# Patient Record
Sex: Female | Born: 1955 | Race: Black or African American | Hispanic: No | Smoking: Current every day smoker
Health system: Southern US, Community
[De-identification: ages and names within clinical notes are randomized; demographics above are authoritative.]

## PROBLEM LIST (undated history)

## (undated) DIAGNOSIS — J449 Chronic obstructive pulmonary disease, unspecified: Secondary | ICD-10-CM

---

## 2008-08-01 ENCOUNTER — Ambulatory Visit: Payer: Self-pay | Admitting: Nurse Practitioner

## 2008-08-01 DIAGNOSIS — F329 Major depressive disorder, single episode, unspecified: Secondary | ICD-10-CM

## 2008-08-01 DIAGNOSIS — R634 Abnormal weight loss: Secondary | ICD-10-CM

## 2008-08-01 LAB — CONVERTED CEMR LAB
Albumin: 4.2 g/dL (ref 3.5–5.2)
Alkaline Phosphatase: 91 units/L (ref 39–117)
BUN: 7 mg/dL (ref 6–23)
Basophils Absolute: 0 10*3/uL (ref 0.0–0.1)
CO2: 26 meq/L (ref 19–32)
Calcium: 9 mg/dL (ref 8.4–10.5)
Creatinine, Ser: 0.73 mg/dL (ref 0.40–1.20)
Glucose, Bld: 61 mg/dL — ABNORMAL LOW (ref 70–99)
HCT: 50.1 % — ABNORMAL HIGH (ref 36.0–46.0)
Lymphocytes Relative: 41 % (ref 12–46)
Lymphs Abs: 2.1 10*3/uL (ref 0.7–4.0)
MCHC: 32.3 g/dL (ref 30.0–36.0)
Monocytes Absolute: 0.5 10*3/uL (ref 0.1–1.0)
Neutrophils Relative %: 46 % (ref 43–77)
Platelets: 282 10*3/uL (ref 150–400)
Potassium: 6.6 meq/L (ref 3.5–5.3)

## 2008-08-02 ENCOUNTER — Encounter (INDEPENDENT_AMBULATORY_CARE_PROVIDER_SITE_OTHER): Payer: Self-pay | Admitting: Nurse Practitioner

## 2008-08-10 ENCOUNTER — Encounter (INDEPENDENT_AMBULATORY_CARE_PROVIDER_SITE_OTHER): Payer: Self-pay | Admitting: *Deleted

## 2008-08-17 ENCOUNTER — Ambulatory Visit: Payer: Self-pay | Admitting: *Deleted

## 2008-08-23 ENCOUNTER — Ambulatory Visit: Payer: Self-pay | Admitting: Nurse Practitioner

## 2008-08-23 DIAGNOSIS — E875 Hyperkalemia: Secondary | ICD-10-CM | POA: Insufficient documentation

## 2008-08-23 DIAGNOSIS — F17200 Nicotine dependence, unspecified, uncomplicated: Secondary | ICD-10-CM | POA: Insufficient documentation

## 2008-08-24 ENCOUNTER — Encounter (INDEPENDENT_AMBULATORY_CARE_PROVIDER_SITE_OTHER): Payer: Self-pay | Admitting: Nurse Practitioner

## 2008-08-24 LAB — CONVERTED CEMR LAB
BUN: 6 mg/dL (ref 6–23)
Calcium: 9.8 mg/dL (ref 8.4–10.5)
Chloride: 103 meq/L (ref 96–112)
Creatinine, Ser: 0.67 mg/dL (ref 0.40–1.20)
Glucose, Bld: 97 mg/dL (ref 70–99)
Lymphs Abs: 2 10*3/uL (ref 0.7–4.0)
MCV: 89.2 fL (ref 78.0–100.0)
Monocytes Absolute: 0.5 10*3/uL (ref 0.1–1.0)
Neutro Abs: 3.6 10*3/uL (ref 1.7–7.7)
Neutrophils Relative %: 57 % (ref 43–77)
RBC: 5.44 M/uL — ABNORMAL HIGH (ref 3.87–5.11)
Triglycerides: 219 mg/dL — ABNORMAL HIGH (ref <150)
WBC: 6.3 10*3/uL (ref 4.0–10.5)

## 2008-08-26 ENCOUNTER — Ambulatory Visit: Payer: Self-pay | Admitting: Nurse Practitioner

## 2008-10-24 ENCOUNTER — Ambulatory Visit: Payer: Self-pay | Admitting: Nurse Practitioner

## 2008-10-24 ENCOUNTER — Encounter (INDEPENDENT_AMBULATORY_CARE_PROVIDER_SITE_OTHER): Payer: Self-pay | Admitting: Nurse Practitioner

## 2008-10-24 DIAGNOSIS — D751 Secondary polycythemia: Secondary | ICD-10-CM | POA: Insufficient documentation

## 2008-10-24 DIAGNOSIS — Z87898 Personal history of other specified conditions: Secondary | ICD-10-CM | POA: Insufficient documentation

## 2008-10-24 DIAGNOSIS — N3000 Acute cystitis without hematuria: Secondary | ICD-10-CM | POA: Insufficient documentation

## 2008-10-24 DIAGNOSIS — Z780 Asymptomatic menopausal state: Secondary | ICD-10-CM | POA: Insufficient documentation

## 2008-10-24 LAB — CONVERTED CEMR LAB
Glucose, Urine, Semiquant: NEGATIVE
Protein, U semiquant: NEGATIVE
pH: 5

## 2008-10-25 ENCOUNTER — Encounter (INDEPENDENT_AMBULATORY_CARE_PROVIDER_SITE_OTHER): Payer: Self-pay | Admitting: Nurse Practitioner

## 2008-10-25 LAB — CONVERTED CEMR LAB
Albumin: 4.2 g/dL (ref 3.5–5.2)
Basophils Absolute: 0 10*3/uL (ref 0.0–0.1)
Basophils Relative: 0 % (ref 0–1)
Calcium: 9.4 mg/dL (ref 8.4–10.5)
Eosinophils Absolute: 0.2 10*3/uL (ref 0.0–0.7)
Erythropoietin: 12 milliintl units/mL (ref 2.6–34.0)
Iron: 153 ug/dL — ABNORMAL HIGH (ref 42–145)
Lymphs Abs: 2.6 10*3/uL (ref 0.7–4.0)
MCV: 85.8 fL (ref 78.0–100.0)
Monocytes Relative: 11 % (ref 3–12)
Neutrophils Relative %: 44 % (ref 43–77)
Platelets: 207 10*3/uL (ref 150–400)
RBC: 4.8 M/uL (ref 3.87–5.11)
Total Bilirubin: 0.4 mg/dL (ref 0.3–1.2)
Total Protein: 7.4 g/dL (ref 6.0–8.3)
Vitamin B-12: 344 pg/mL (ref 211–911)
WBC: 6.2 10*3/uL (ref 4.0–10.5)

## 2008-10-28 ENCOUNTER — Encounter (INDEPENDENT_AMBULATORY_CARE_PROVIDER_SITE_OTHER): Payer: Self-pay | Admitting: Nurse Practitioner

## 2008-10-29 ENCOUNTER — Encounter (INDEPENDENT_AMBULATORY_CARE_PROVIDER_SITE_OTHER): Payer: Self-pay | Admitting: Nurse Practitioner

## 2008-10-31 LAB — CONVERTED CEMR LAB
Chlamydia, DNA Probe: NEGATIVE
GC Probe Amp, Genital: NEGATIVE

## 2008-11-08 ENCOUNTER — Encounter (INDEPENDENT_AMBULATORY_CARE_PROVIDER_SITE_OTHER): Payer: Self-pay | Admitting: *Deleted

## 2008-11-10 ENCOUNTER — Telehealth (INDEPENDENT_AMBULATORY_CARE_PROVIDER_SITE_OTHER): Payer: Self-pay | Admitting: Nurse Practitioner

## 2008-11-10 ENCOUNTER — Ambulatory Visit: Payer: Self-pay | Admitting: Nurse Practitioner

## 2008-11-10 LAB — CONVERTED CEMR LAB: PTH: 45.9 pg/mL (ref 14.0–72.0)

## 2008-11-14 ENCOUNTER — Ambulatory Visit (HOSPITAL_COMMUNITY): Admission: RE | Admit: 2008-11-14 | Discharge: 2008-11-14 | Payer: Self-pay | Admitting: Family Medicine

## 2008-11-14 ENCOUNTER — Encounter (INDEPENDENT_AMBULATORY_CARE_PROVIDER_SITE_OTHER): Payer: Self-pay | Admitting: Nurse Practitioner

## 2008-11-17 DIAGNOSIS — M949 Disorder of cartilage, unspecified: Secondary | ICD-10-CM

## 2008-11-17 DIAGNOSIS — M899 Disorder of bone, unspecified: Secondary | ICD-10-CM | POA: Insufficient documentation

## 2008-11-18 ENCOUNTER — Encounter (INDEPENDENT_AMBULATORY_CARE_PROVIDER_SITE_OTHER): Payer: Self-pay | Admitting: Nurse Practitioner

## 2008-11-18 ENCOUNTER — Ambulatory Visit (HOSPITAL_COMMUNITY): Admission: RE | Admit: 2008-11-18 | Discharge: 2008-11-18 | Payer: Self-pay | Admitting: Nurse Practitioner

## 2008-11-22 ENCOUNTER — Encounter (INDEPENDENT_AMBULATORY_CARE_PROVIDER_SITE_OTHER): Payer: Self-pay | Admitting: Nurse Practitioner

## 2008-11-28 ENCOUNTER — Encounter (INDEPENDENT_AMBULATORY_CARE_PROVIDER_SITE_OTHER): Payer: Self-pay | Admitting: Nurse Practitioner

## 2008-12-05 ENCOUNTER — Ambulatory Visit (HOSPITAL_COMMUNITY): Admission: RE | Admit: 2008-12-05 | Discharge: 2008-12-05 | Payer: Self-pay | Admitting: Family Medicine

## 2008-12-06 ENCOUNTER — Telehealth (INDEPENDENT_AMBULATORY_CARE_PROVIDER_SITE_OTHER): Payer: Self-pay | Admitting: *Deleted

## 2008-12-06 DIAGNOSIS — K029 Dental caries, unspecified: Secondary | ICD-10-CM | POA: Insufficient documentation

## 2008-12-15 ENCOUNTER — Encounter (INDEPENDENT_AMBULATORY_CARE_PROVIDER_SITE_OTHER): Payer: Self-pay | Admitting: Nurse Practitioner

## 2008-12-27 ENCOUNTER — Telehealth (INDEPENDENT_AMBULATORY_CARE_PROVIDER_SITE_OTHER): Payer: Self-pay | Admitting: Nurse Practitioner

## 2009-01-10 ENCOUNTER — Encounter (INDEPENDENT_AMBULATORY_CARE_PROVIDER_SITE_OTHER): Payer: Self-pay | Admitting: *Deleted

## 2009-01-26 ENCOUNTER — Telehealth (INDEPENDENT_AMBULATORY_CARE_PROVIDER_SITE_OTHER): Payer: Self-pay | Admitting: *Deleted

## 2009-09-04 ENCOUNTER — Ambulatory Visit: Payer: Self-pay | Admitting: Nurse Practitioner

## 2009-09-04 DIAGNOSIS — R030 Elevated blood-pressure reading, without diagnosis of hypertension: Secondary | ICD-10-CM | POA: Insufficient documentation

## 2009-09-05 ENCOUNTER — Encounter (INDEPENDENT_AMBULATORY_CARE_PROVIDER_SITE_OTHER): Payer: Self-pay | Admitting: Nurse Practitioner

## 2009-10-31 ENCOUNTER — Ambulatory Visit: Payer: Self-pay | Admitting: Nurse Practitioner

## 2009-10-31 DIAGNOSIS — L659 Nonscarring hair loss, unspecified: Secondary | ICD-10-CM | POA: Insufficient documentation

## 2009-10-31 LAB — CONVERTED CEMR LAB
ALT: 12 units/L (ref 0–35)
AST: 20 units/L (ref 0–37)
Alkaline Phosphatase: 66 units/L (ref 39–117)
Basophils Absolute: 0 10*3/uL (ref 0.0–0.1)
Chloride: 103 meq/L (ref 96–112)
Creatinine, Ser: 0.82 mg/dL (ref 0.40–1.20)
Glucose, Bld: 82 mg/dL (ref 70–99)
Hemoglobin: 13.9 g/dL (ref 12.0–15.0)
Lymphocytes Relative: 47 % — ABNORMAL HIGH (ref 12–46)
Lymphs Abs: 2.9 10*3/uL (ref 0.7–4.0)
Monocytes Relative: 11 % (ref 3–12)
Neutro Abs: 2.3 10*3/uL (ref 1.7–7.7)
Neutrophils Relative %: 36 % — ABNORMAL LOW (ref 43–77)
Potassium: 4.7 meq/L (ref 3.5–5.3)
Rapid HIV Screen: NEGATIVE
Sodium: 141 meq/L (ref 135–145)
Total Bilirubin: 0.3 mg/dL (ref 0.3–1.2)
Total Protein: 7.4 g/dL (ref 6.0–8.3)

## 2009-11-09 ENCOUNTER — Encounter (INDEPENDENT_AMBULATORY_CARE_PROVIDER_SITE_OTHER): Payer: Self-pay | Admitting: Nurse Practitioner

## 2009-12-25 ENCOUNTER — Ambulatory Visit (HOSPITAL_COMMUNITY): Admission: RE | Admit: 2009-12-25 | Discharge: 2009-12-25 | Payer: Self-pay | Admitting: Internal Medicine

## 2010-01-08 ENCOUNTER — Ambulatory Visit: Payer: Self-pay | Admitting: Nurse Practitioner

## 2010-01-08 LAB — CONVERTED CEMR LAB
Cholesterol: 167 mg/dL (ref 0–200)
GC Probe Amp, Genital: NEGATIVE
LDL Cholesterol: 69 mg/dL (ref 0–99)
Protein, U semiquant: NEGATIVE
Total CHOL/HDL Ratio: 2.8
Triglycerides: 190 mg/dL — ABNORMAL HIGH (ref <150)
Urobilinogen, UA: 0.2
WBC Urine, dipstick: NEGATIVE

## 2010-01-10 ENCOUNTER — Encounter (INDEPENDENT_AMBULATORY_CARE_PROVIDER_SITE_OTHER): Payer: Self-pay | Admitting: Nurse Practitioner

## 2010-01-11 LAB — CONVERTED CEMR LAB: Pap Smear: NEGATIVE

## 2010-03-08 ENCOUNTER — Ambulatory Visit: Payer: Self-pay | Admitting: Nurse Practitioner

## 2010-03-16 ENCOUNTER — Ambulatory Visit: Payer: Self-pay | Admitting: Nurse Practitioner

## 2010-04-13 ENCOUNTER — Ambulatory Visit: Payer: Self-pay | Admitting: Nurse Practitioner

## 2010-08-22 ENCOUNTER — Ambulatory Visit: Payer: Self-pay | Admitting: Nurse Practitioner

## 2010-11-23 ENCOUNTER — Telehealth (INDEPENDENT_AMBULATORY_CARE_PROVIDER_SITE_OTHER): Payer: Self-pay | Admitting: Nurse Practitioner

## 2010-12-02 ENCOUNTER — Encounter: Payer: Self-pay | Admitting: Family Medicine

## 2010-12-02 ENCOUNTER — Encounter: Payer: Self-pay | Admitting: Internal Medicine

## 2010-12-06 ENCOUNTER — Ambulatory Visit
Admission: RE | Admit: 2010-12-06 | Discharge: 2010-12-06 | Payer: Self-pay | Source: Home / Self Care | Attending: Nurse Practitioner | Admitting: Nurse Practitioner

## 2010-12-06 ENCOUNTER — Telehealth (INDEPENDENT_AMBULATORY_CARE_PROVIDER_SITE_OTHER): Payer: Self-pay | Admitting: Nurse Practitioner

## 2010-12-06 ENCOUNTER — Encounter (INDEPENDENT_AMBULATORY_CARE_PROVIDER_SITE_OTHER): Payer: Self-pay | Admitting: Nurse Practitioner

## 2010-12-06 DIAGNOSIS — R252 Cramp and spasm: Secondary | ICD-10-CM | POA: Insufficient documentation

## 2010-12-06 LAB — CONVERTED CEMR LAB
ALT: 10 units/L (ref 0–35)
AST: 18 units/L (ref 0–37)
BUN: 7 mg/dL (ref 6–23)
Basophils Absolute: 0 10*3/uL (ref 0.0–0.1)
Basophils Relative: 1 % (ref 0–1)
Calcium: 9.6 mg/dL (ref 8.4–10.5)
Creatinine, Ser: 0.76 mg/dL (ref 0.40–1.20)
Eosinophils Relative: 3 % (ref 0–5)
Hemoglobin: 13.2 g/dL (ref 12.0–15.0)
Lymphs Abs: 2.3 10*3/uL (ref 0.7–4.0)
MCHC: 32.8 g/dL (ref 30.0–36.0)
MCV: 86.1 fL (ref 78.0–100.0)
Monocytes Relative: 8 % (ref 3–12)
Neutrophils Relative %: 51 % (ref 43–77)
Platelets: 255 10*3/uL (ref 150–400)
RBC: 4.67 M/uL (ref 3.87–5.11)
RDW: 16.1 % — ABNORMAL HIGH (ref 11.5–15.5)
Total Bilirubin: 0.3 mg/dL (ref 0.3–1.2)
Total Protein: 7.2 g/dL (ref 6.0–8.3)

## 2010-12-07 ENCOUNTER — Encounter (INDEPENDENT_AMBULATORY_CARE_PROVIDER_SITE_OTHER): Payer: Self-pay | Admitting: Nurse Practitioner

## 2010-12-11 NOTE — Letter (Signed)
Summary: REFERRAL [facility]  REFERRAL [facility]   Imported By: Arta Bruce 05/02/2010 09:50:34  _____________________________________________________________________  External Attachment:    Type:   Image     Comment:   External Document

## 2010-12-11 NOTE — Progress Notes (Signed)
Summary: Office Visit//DEPRESSION SCREENING  Office Visit//DEPRESSION SCREENING   Imported By: Arta Bruce 03/08/2010 12:47:26  _____________________________________________________________________  External Attachment:    Type:   Image     Comment:   External Document

## 2010-12-11 NOTE — Letter (Signed)
Summary: Lipid Letter  [facility]  [address] [place], [place] [address]   Phone: [phone]  Fax: [phone]    01/10/2010  Sherry Douglas [address] [place], [place]  [address]  Dear Sherry Douglas:  We have carefully reviewed your last lipid profile from 01/08/2010 and the results are noted below with a summary of recommendations for lipid management.    Cholesterol:       167     Goal: less than 200   HDL "good" Cholesterol:   60     Goal: greater than 40   LDL "bad" Cholesterol:   69     Goal: less than 130   Triglycerides:       190     Goal: less than 140    Labs done during recent office visit show that your triglycerides are slightly elevated.  This has to do with the amount of fat in your food.  No need for medication at this time, I just wanted to make you aware.  Pap Smear results _______________________________.      Current Medications: 1)    Remeron 15 Mg Tabs (Mirtazapine) .Marland Kitchen.. 1 tablet by mouth nightly for mood and appetite 2)    Calcium 600-d 600-400 Mg-unit Tabs (Calcium carbonate-vitamin d) .Marland Kitchen.. 1 tablet by mouth two times a day for bones 3)    Actonel 35 Mg Tabs (Risedronate sodium) .Marland Kitchen.. 1 tablet by mouth weekly for bones 4)    Sertraline Hcl 50 Mg Tabs (Sertraline hcl) .... 1/2 tablet by mouth nightly for mood then increase to 1 tablet by mouth nightly for mood  If you have any questions, please call. We appreciate being able to work with you.   Sincerely,    [facility] Lehman Prom FNP

## 2010-12-11 NOTE — Assessment & Plan Note (Signed)
Summary: Depression   Vital Signs:  Patient profile:   55 year old female Menstrual status:  postmenopausal Height:      62 inches Weight:      94 pounds Temp:     100.2 degrees F oral Pulse rate:   102 / minute Pulse rhythm:   regular Resp:     16 per minute BP sitting:   136 / 78  (left arm) Cuff size:   regular  Vitals Entered By: Michelle Nasuti (August 22, 2010 2:08 PM) CC: review meds, Depression Is Patient Diabetic? No Pain Assessment Patient in pain? no       Does patient need assistance? Functional Status Self care Ambulation Normal   CC:  review meds and Depression.  History of Present Illness:  Pt into the office for routine f/u. Pt is still employed at the [employer]. She reports that when she worked one day last week she had extreme right sided pain.  She had to leave work early.  She went home and rested. When she work the next day the pain was improved.    Depression History:      The patient denies recurrent thoughts of death or suicide.        Psychosocial stress factors include major life changes.  The patient denies that she feels like life is not worth living, denies that she wishes that she were dead, and denies that she has thought about ending her life.  Due to her current symptoms, it often takes extra effort to do the things she needs to do.        Comments:  Pt presents with empty bottles and she reports that she has only been without for a couple of days but is appears that pt last got Rx on 6/2/12011.  Depression Treatment History:  Prior Medication Used:   Start Date: Assessment of Effect:   Comments:  Celexa (citalopram)     --       --       pt stopped taking meds - no side effects Zoloft (sertraline)     01/08/2010   some improvement     started Wellbutrin (bupropion)     09/04/2009     --       pt stopped taking meds Remeron (mirtazapine)     --       --       taking to help with appetite   Habits &  Providers  Alcohol-Tobacco-Diet     Alcohol drinks/day: 0     Tobacco Status: current     Tobacco Counseling: to quit use of tobacco products     Cigarette Packs/Day: <0.25  Exercise-Depression-Behavior     Does Patient Exercise: no     Have you felt down or hopeless? yes     Have you felt little pleasure in things? yes     Depression Counseling: not indicated; screening negative for depression  Current Medications (verified): 1)  Remeron 15 Mg Tabs (Mirtazapine) .Marland Kitchen.. 1 Tablet By Mouth Nightly For Mood and Appetite 2)  Calcium 600-D 600-400 Mg-Unit Tabs (Calcium Carbonate-Vitamin D) .Marland Kitchen.. 1 Tablet By Mouth Two Times A Day For Bones 3)  Actonel 35 Mg Tabs (Risedronate Sodium) .Marland Kitchen.. 1 Tablet By Mouth Weekly For Bones 4)  Sertraline Hcl 100 Mg Tabs (Sertraline Hcl) .... One Tablet By Mouth Nightly For Mood **note Increase Dose**  Allergies (verified): No Known Drug Allergies  Social History: Packs/Day:  <0.25  Review of Systems General:  Complains of loss of appetite; denies fever and sleep disorder; +hot flashes sleep habits have improved. ENT:  Denies nasal congestion. CV:  Denies chest pain or discomfort. Resp:  Denies cough. GI:  Denies abdominal pain, nausea, and vomiting. GU:  Complains of dysuria.  Physical Exam  General:  alert.   Head:  normocephalic.   Lungs:  normal breath sounds.   Heart:  normal rate and regular rhythm.   Abdomen:  flat BS x 4 nontender   Impression & Recommendations:  Problem # 1:  DEPRESSION (ICD-311)  Her updated medication list for this problem includes:    Remeron 15 Mg Tabs (Mirtazapine) .Marland Kitchen... 1 tablet by mouth nightly for mood and appetite    Sertraline Hcl 100 Mg Tabs (Sertraline hcl) ..... One tablet by mouth nightly for mood **note increase dose**  Problem # 2:  ELEVATED BLOOD PRESSURE WITHOUT DIAGNOSIS OF HYPERTENSION (ICD-796.2) bp stable  Problem # 3:  TOBACCO ABUSE (ICD-305.1) advised cessation  Complete Medication  List: 1)  Remeron 15 Mg Tabs (Mirtazapine) .Marland Kitchen.. 1 tablet by mouth nightly for mood and appetite 2)  Calcium 600-d 600-400 Mg-unit Tabs (Calcium carbonate-vitamin d) .Marland Kitchen.. 1 tablet by mouth two times a day for bones 3)  Actonel 35 Mg Tabs (Risedronate sodium) .Marland Kitchen.. 1 tablet by mouth weekly for bones 4)  Sertraline Hcl 100 Mg Tabs (Sertraline hcl) .... One tablet by mouth nightly for mood **note increase dose**  Other Orders: Flu Vaccine 75yrs + (04540) Admin 1st Vaccine (98119) Admin 1st Vaccine [facility]) [phone])  Patient Instructions: 1)  You have a low grade temp today in office. 2)  Check your temperature tonight before bed to make sure that you are not running a fever. 3)  Restart your medications.  It looks like you have been without them for a while.   4)  You have received the flu vaccine today. 5)  Follow up in this office in March 2012 for CPE or sooner if necessary.  If you are sice before you next visit call for a "sick" appointment   Influenza Vaccine    Vaccine Type: Fluvax 3+    Site: left deltoid    Mfr: GlaxoSmithKline    Dose: 0.5 ml    Route: IM    Given by: Levon Hedger    Exp. Date: 04/2011    Lot #: [id number]    VIS given: 06/05/10 version given August 22, 2010.  Flu Vaccine Consent Questions    Do you have a history of severe allergic reactions to this vaccine? no    Any prior history of allergic reactions to egg and/or gelatin? no    Do you have a sensitivity to the preservative Thimersol? no    Do you have a past history of Guillan-Barre Syndrome? no    Do you currently have an acute febrile illness? no    Have you ever had a severe reaction to latex? no    Vaccine information given and explained to patient? yes    Are you currently pregnant? no    ndc [phone]

## 2010-12-11 NOTE — Assessment & Plan Note (Signed)
Summary: Complete Physical Exam   Vital Signs:  Patient profile:   55 year old female Menstrual status:  postmenopausal Weight:      94.7 pounds Temp:     98.1 degrees F oral Pulse rate:   85 / minute Pulse rhythm:   regular Resp:     20 per minute BP sitting:   127 / 88  (left arm) Cuff size:   regular  Vitals Entered By: Levon Hedger (January 08, 2010 2:37 PM) CC: CPP, Depression Is Patient Diabetic? No Pain Assessment Patient in pain? no       Does patient need assistance? Functional Status Self care Ambulation Normal   CC:  CPP and Depression.  History of Present Illness:  Pt into the office for a complete physical Exam  PAP - done last in this office with normal results.  All previous PAPs ok. no family hx of cervical or ovarian CA Pt has 3 children - natural deliveries  Mammogram - done last on 12/25/2009 No family hx of breast cancer  Optho - no current glasses or contacts. " I need some glasses"  Last eye exam was several years ago.  Dental - still trying to get an appointment.  She is on the list for total extraction and then will need dentures for which she will have to pay $300 for and she is not able to do so at this time  tdap - up to date.  Social - pt has started a part time job at the [employer] after over 1 year of unemployment  Depression History:      Positive alarm features for depression include fatigue (loss of energy).  However, she denies recurrent thoughts of death or suicide.        The patient denies that she feels like life is not worth living, denies that she wishes that she were dead, and denies that she has thought about ending her life.        Comments:  Samples of lexapro given in 10/2009.  pt completed the supply but did not continue.  Depression Treatment History:  Prior Medication Used:   Start Date: Assessment of Effect:   Comments:  Celexa (citalopram)     --       --       pt stopped taking meds - no side  effects Zoloft (sertraline)     01/08/2010     --       started Wellbutrin (bupropion)     09/04/2009     --       pt stopped taking meds Remeron (mirtazapine)     --       --       taking to help with appetite   Habits & Providers  Alcohol-Tobacco-Diet     Alcohol drinks/day: <1     Alcohol type: beer     Tobacco Status: current     Tobacco Counseling: to quit use of tobacco products     Cigarette Packs/Day: less than 1/2 ppd     Year Started: age 25  Exercise-Depression-Behavior     Does Patient Exercise: no     Have you felt down or hopeless? yes     Have you felt little pleasure in things? yes     Depression Counseling: further diagnostic testing and/or other treatment is indicated     Drug Use: yes  Comments: Pt has a quit date of this week  Medications Prior to Update: 1)  Remeron 15 Mg Tabs (Mirtazapine) .Marland Kitchen.. 1 Tablet By Mouth Nightly For Mood and Appetite 2)  Calcium 600-D 600-400 Mg-Unit Tabs (Calcium Carbonate-Vitamin D) .Marland Kitchen.. 1 Tablet By Mouth Two Times A Day For Bones 3)  Actonel 35 Mg Tabs (Risedronate Sodium) .Marland Kitchen.. 1 Tablet By Mouth Weekly For Bones 4)  Lexapro 10 Mg Tabs (Escitalopram Oxalate) .... One Tablet Daily For Mood  Allergies (verified): No Known Drug Allergies  Review of Systems General:  Complains of loss of appetite; denies fever. Eyes:  Denies discharge. ENT:  Denies earache. CV:  Denies chest pain or discomfort. Resp:  Denies cough. GI:  Denies constipation. GU:  Denies abnormal vaginal bleeding. MS:  Denies joint pain. Derm:  Denies rash. Neuro:  Denies headaches. Psych:  Complains of depression; denies anxiety. Endo:  Denies excessive thirst and excessive urination.  Physical Exam  General:  alert.  thin Head:  normocephalic.   Eyes:  pupils equal and pupils round.   Ears:  small external canals - minimal cerumen top of TM visible Nose:  no nasal discharge.   Mouth:  pharynx pink and moist.   Neck:  supple.   Chest Wall:  no  mass.   Breasts:  skin/areolae normal and no masses.   Lungs:  normal breath sounds.   Heart:  normal rate and regular rhythm.   Abdomen:  soft, non-tender, and normal bowel sounds.   Rectal:  no external abnormalities.   Msk:  normal ROM.   Pulses:  R radial normal and L radial normal.   Extremities:  no edema Neurologic:  alert & oriented X3.   Skin:  color normal.   Psych:  Oriented X3.    Pelvic Exam  Vulva:      normal appearance.   Urethra and Bladder:      Urethra--no discharge.   Vagina:      physiologic discharge.   Cervix:      midposition.   Adnexa:      nontender bilaterally.   Rectum:      normal, heme negative stool.      Impression & Recommendations:  Problem # 1:  ROUTINE GYNECOLOGICAL EXAMINATION (ICD-V72.31) lipids done, other labs reviewed from previous visit rec optho exam maintain dental exam EKG done guaiac negative PAP done Orders: T-Lipid Profile [phone]) T- GC Chlamydia (09811) Hemoccult Guaiac-1 spec.(in office) (82270) KOH/ WET Mount [phone]) Pap Smear, Thin Prep ( Collection of) (G9562) UA Dipstick w/o Micro (manual) (81002)  Problem # 2:  OTHER SCREENING BREAST EXAMINATION (ICD-V76.19) self breast exam placcard given mammogrm up to day  Problem # 3:  DEPRESSION (ICD-311) still ongoing. will start sertraline The following medications were removed from the medication list:    Lexapro 10 Mg Tabs (Escitalopram oxalate) ..... One tablet daily for mood Her updated medication list for this problem includes:    Remeron 15 Mg Tabs (Mirtazapine) .Marland Kitchen... 1 tablet by mouth nightly for mood and appetite    Sertraline Hcl 50 Mg Tabs (Sertraline hcl) .Marland Kitchen... 1/2 tablet by mouth nightly for mood then increase to 1 tablet by mouth nightly for mood  Problem # 4:  OSTEOPENIA (ICD-733.90) noted previously on bone density Her updated medication list for this problem includes:    Calcium 600-d 600-400 Mg-unit Tabs (Calcium carbonate-vitamin d)  .Marland Kitchen... 1 tablet by mouth two times a day for bones    Actonel 35 Mg Tabs (Risedronate sodium) .Marland Kitchen... 1 tablet by mouth weekly for bones  Problem # 5:  ELEVATED BLOOD PRESSURE WITHOUT  DIAGNOSIS OF HYPERTENSION (ICD-796.2) stable today continue DASH diet Orders: T-Lipid Profile (09811-91478) EKG w/ Interpretation (93000)  Complete Medication List: 1)  Remeron 15 Mg Tabs (Mirtazapine) .Marland Kitchen.. 1 tablet by mouth nightly for mood and appetite 2)  Calcium 600-d 600-400 Mg-unit Tabs (Calcium carbonate-vitamin d) .Marland Kitchen.. 1 tablet by mouth two times a day for bones 3)  Actonel 35 Mg Tabs (Risedronate sodium) .Marland Kitchen.. 1 tablet by mouth weekly for bones 4)  Sertraline Hcl 50 Mg Tabs (Sertraline hcl) .... 1/2 tablet by mouth nightly for mood then increase to 1 tablet by mouth nightly for mood  Patient Instructions: 1)  Mood - Start the new medication - Sertraline 50mg   2)  1/2 tablet by mouth nightly x 1 week then increase to 1 tablet by mouth nightly for mood. 3)  Take this for at least 4-6 weeks to see if you have any improvement 4)  [facility] has the cheapest eye exams.  You can take the prescription anywhere to get your glasses 5)  You will be notified of any abnormal lab result 6)  Follow up in 2 months for medications review - sertraline Prescriptions: SERTRALINE HCL 50 MG TABS (SERTRALINE HCL) 1/2 tablet by mouth nightly for mood then increase to 1 tablet by mouth nightly for mood  #30 x 3   Entered and Authorized by:   Lehman Prom FNP   Signed by:   Lehman Prom FNP on 01/08/2010   Method used:   Faxed to ...       [facility] (retail)       [address] [place], [place]  [address]       Ph: [phone]       Fax: [phone]   RxID:   [id number]   Laboratory Results   Urine Tests  Date/Time Received: January 08, 2010 2:52 PM  Date/Time Reported: January 08, 2010 2:52 PM   Routine Urinalysis   Color: lt. yellow Appearance:  Clear Glucose: negative   (Normal Range: Negative) Bilirubin: negative   (Normal Range: Negative) Ketone: negative   (Normal Range: Negative) Spec. Gravity: <1.005   (Normal Range: 1.003-1.035) Blood: negative   (Normal Range: Negative) pH: 5.0   (Normal Range: 5.0-8.0) Protein: negative   (Normal Range: Negative) Urobilinogen: 0.2   (Normal Range: 0-1) Nitrite: negative   (Normal Range: Negative) Leukocyte Esterace: negative   (Normal Range: Negative)      Wet Mount/KOH Source: vaginal WBC/hpf: 1-5 Bacteria/hpf: rare Clue cells/hpf: none Yeast/hpf: none Trichomonas/hpf: none  Stool - Occult Blood Hemmoccult #1: negative Date: 01/08/2010    Prevention & Chronic Care Immunizations   Influenza vaccine: Fluvax 3+  (09/04/2009)    Tetanus booster: 10/24/2008: Tdap    Pneumococcal vaccine: Not documented  Colorectal Screening   Hemoccult: negative  (01/08/2010)   Hemoccult action/deferral: Ordered  (01/08/2010)   Hemoccult due: 01/08/2011    Colonoscopy: indication given to pt  (01/08/2010)   Colonoscopy action/deferral: Deferred  (01/08/2010)  Other Screening   Pap smear:  Specimen Adequacy: Satisfactory for evaluation.   Interpretation/Result:Negative for intraepithelial Lesion or Malignancy.     (10/24/2008)   Pap smear action/deferral: PAP smear done  (10/24/2008)   Pap smear due: 01/08/2011    Mammogram: ASSESSMENT: Negative - BI-RADS 1^MM DIGITAL SCREENING  (12/25/2009)   Mammogram action/deferral: Screening mammogram in 1 year.     (11/18/2008)   Mammogram due: 12/25/2010   Smoking status: current  (01/08/2010)   Smoking  cessation counseling: yes  (08/23/2008)  Lipids   Total Cholesterol: 190  (08/24/2008)   Lipid panel action/deferral: Lipid Panel ordered   LDL: 80  (08/24/2008)   LDL Direct: Not documented   HDL: 66  (08/24/2008)   Triglycerides: 219  (08/24/2008)      Osteoporosis  Patient complains of: kyphosis No back pain No prior  fracture No height loss No  Patient reports: Personal history of fracture No Hx of Fx in a 1st degree relative No Caucasian/Asian Race No Advanced Age No Female Gender Yes Dementia No Poor health/fragility No Current cigarette smoker Yes Low body weight (<127 lbs) No Estrogen deficiency Yes Low calcium intake (lifelong) No Alcoholism No Inadequate physical activity No Poor eyesight/risk of falls No  Laboratory Results   Urine Tests    Routine Urinalysis   Color: lt. yellow Appearance: Clear Glucose: negative   (Normal Range: Negative) Bilirubin: negative   (Normal Range: Negative) Ketone: negative   (Normal Range: Negative) Spec. Gravity: <1.005   (Normal Range: 1.003-1.035) Blood: negative   (Normal Range: Negative) pH: 5.0   (Normal Range: 5.0-8.0) Protein: negative   (Normal Range: Negative) Urobilinogen: 0.2   (Normal Range: 0-1) Nitrite: negative   (Normal Range: Negative) Leukocyte Esterace: negative   (Normal Range: Negative)      Wet Mount Wet Mount KOH: Negative  Stool - Occult Blood Hemmoccult #1: negative

## 2010-12-11 NOTE — Assessment & Plan Note (Signed)
Summary: Depression   Vital Signs:  Patient profile:   55 year old female Menstrual status:  postmenopausal Weight:      93.5 pounds BMI:     17.16 BSA:     1.38 Temp:     98.2 degrees F oral Pulse rate:   89 / minute Pulse rhythm:   regular Resp:     20 per minute BP sitting:   123 / 80  (left arm) Cuff size:   regular  Vitals Entered By: Levon Hedger (March 08, 2010 3:03 PM) CC: follow-up visit med review, Depression Is Patient Diabetic? No Pain Assessment Patient in pain? no       Does patient need assistance? Functional Status Self care Ambulation Normal   CC:  follow-up visit med review and Depression.  History of Present Illness:  Pt into the office to f/u on meds. Depression - she was started on zoloft during her visit pt is taking the zoloft but reports that she does not see any difference in her mood.  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  However, she denies significant weight gain and recurrent thoughts of death or suicide.        The patient denies that she feels like life is not worth living, denies that she wishes that she were dead, and denies that she has thought about ending her life.         Depression Treatment History:  Prior Medication Used:   Start Date: Assessment of Effect:   Comments:  Celexa (citalopram)     --       --       pt stopped taking meds - no side effects Zoloft (sertraline)     01/08/2010   some improvement     started Wellbutrin (bupropion)     09/04/2009     --       pt stopped taking meds Remeron (mirtazapine)     --       --       taking to help with appetite   Habits & Providers  Alcohol-Tobacco-Diet     Alcohol drinks/day: <1     Alcohol type: beer     Tobacco Status: current     Tobacco Counseling: to quit use of tobacco products    Cigarette Packs/Day: less than 1/2 ppd     Year Started: age 89  Exercise-Depression-Behavior     Does Patient Exercise: no     Have you felt down or hopeless? yes     Have you felt little pleasure in things? yes     Depression Counseling: further diagnostic testing and/or other treatment is indicated     Drug Use: yes  Comments: Pt previously scheduled to see Aquilla Solian - LCSW but she cancelled the appt and did not reschedule  Medications Prior to Update: 1)  Remeron 15 Mg Tabs (Mirtazapine) .Marland Kitchen.. 1 Tablet By Mouth Nightly For Mood and Appetite 2)  Calcium 600-D 600-400 Mg-Unit Tabs (Calcium Carbonate-Vitamin D) .Marland Kitchen.. 1 Tablet By Mouth Two Times A Day For Bones 3)  Actonel 35 Mg Tabs (Risedronate Sodium) .Marland Kitchen.. 1 Tablet By Mouth Weekly For Bones 4)  Sertraline Hcl 50 Mg Tabs (Sertraline Hcl) .... 1/2 Tablet By Mouth Nightly For Mood Then Increase To 1 Tablet By Mouth Nightly  For Mood  Allergies (verified): No Known Drug Allergies  Review of Systems CV:  Denies chest pain or discomfort. Resp:  Denies cough. Psych:  Complains of depression.  Physical Exam  General:  alert.  petite Head:  normocephalic.   Ears:  ear piercing(s) noted.   Lungs:  normal breath sounds.   Heart:  normal rate and regular rhythm.   Msk:  up to the exam table Neurologic:  alert & oriented X3.   Skin:  color normal.   Psych:  Oriented X3.     Impression & Recommendations:  Problem # 1:  DEPRESSION (ICD-311)  no much improvement on sertraline  will increase to 100mg  advised pt to reschedule appt with LCSW  Her updated medication list for this problem includes:    Remeron 15 Mg Tabs (Mirtazapine) .Marland Kitchen... 1 tablet by mouth nightly for mood and appetite    Sertraline Hcl 100 Mg Tabs (Sertraline hcl) ..... One tablet by mouth nightly for mood **note increase dose**  Orders: Misc. Referral (Misc. Ref)  Problem # 2:  TOBACCO ABUSE (ICD-305.1) advised cessation  Complete Medication List: 1)   Remeron 15 Mg Tabs (Mirtazapine) .Marland Kitchen.. 1 tablet by mouth nightly for mood and appetite 2)  Calcium 600-d 600-400 Mg-unit Tabs (Calcium carbonate-vitamin d) .Marland Kitchen.. 1 tablet by mouth two times a day for bones 3)  Actonel 35 Mg Tabs (Risedronate sodium) .Marland Kitchen.. 1 tablet by mouth weekly for bones 4)  Sertraline Hcl 100 Mg Tabs (Sertraline hcl) .... One tablet by mouth nightly for mood **note increase dose**  Patient Instructions: 1)  Increase sertralizine to 100mg  by mouth nightly 2)  Schedule appointment with Aquilla Solian - Social Work 3)  Follow up with n.martin, fnp in 6 weeks for medication review Prescriptions: SERTRALINE HCL 100 MG TABS (SERTRALINE HCL) One tablet by mouth nightly for mood **note increase dose**  #30 x 5   Entered and Authorized by:   Lehman Prom FNP   Signed by:   Lehman Prom FNP on 03/08/2010   Method used:   Faxed to ...       [facility] (retail)       [address] [place], [place]  [address]       Ph: [phone]       Fax: [phone]   RxID:   [id number]

## 2010-12-13 NOTE — Letter (Signed)
Summary: *HSN Results Follow up  [facility]  [address] [place], [place] [address]   Phone: [phone]  Fax: [phone]      12/07/2010   Sherry Douglas [address] [place], [place]  [address]   Dear  Ms. [name],                            ____S.Drinkard,FNP   ____D. Gore,FNP       ____B. McPherson,MD   ____V. Rankins,MD    ____E. Mulberry,MD    _X___N. Daphine Deutscher, FNP  ____D. Reche Dixon, MD    ____K. Philipp Deputy, MD    ____Other     This letter is to inform you that your recent test(s):  _______Pap Smear    ___X____Lab Test     _______X-ray    ___X____ is within acceptable limits  _______ requires a medication change  _______ requires a follow-up lab visit  _______ requires a follow-up visit with your provider   Comments:  Labs done during recent office visit are normal. Your potassium is normal.       _________________________________________________________ If you have any questions, please contact our office [phone].                    Sincerely,    Lehman Prom FNP [facility]

## 2010-12-13 NOTE — Progress Notes (Signed)
Summary: Need Triage visit  Phone Note Outgoing Call   Summary of Call: This pt was seen in the office today. My intent was to have her come back for a triage visit in 2 weeks for blood pressure check.   Goal < 135/85 Schedule this for pt and please tell her that I asked you to call her.  She and I discussed it but I forgot to put it on her check out sheet Initial call taken by: Lehman Prom FNP,  December 06, 2010 12:16 PM  Follow-up for Phone Call        Pt. made aware -- triage appt. for BP recheck 12/27/10.  Dutch Quint RN  December 06, 2010 3:21 PM.

## 2010-12-13 NOTE — Progress Notes (Signed)
Summary: Change meds?  Phone Note Outgoing Call   Summary of Call: [place] [facility] called -- does not have Actonel, only has fosamax 35 or 70.  What do you want her to have? Initial call taken by: Dutch Quint RN,  November 23, 2010 4:04 PM  Follow-up for Phone Call        med changed to fosamax Follow-up by: Lehman Prom FNP,  November 23, 2010 5:20 PM    New/Updated Medications: FOSAMAX 70 MG TABS (ALENDRONATE SODIUM) One tablet by mouth weekly for bones Prescriptions: FOSAMAX 70 MG TABS (ALENDRONATE SODIUM) One tablet by mouth weekly for bones  #4 x 12   Entered and Authorized by:   Lehman Prom FNP   Signed by:   Lehman Prom FNP on 11/23/2010   Method used:   Faxed to ...       [facility] - [facility] (retail)       [address] [place], [place]  [address]       Ph: [phone]       Fax: [phone]   RxID:   [id number]

## 2010-12-13 NOTE — Assessment & Plan Note (Signed)
Summary: Leg cramps   Vital Signs:  Patient profile:   55 year old female Menstrual status:  postmenopausal Weight:      94.8 pounds BMI:     17.40 Temp:     98.6 degrees F oral Pulse rate:   88 / minute Pulse rhythm:   regular Resp:     20 per minute BP sitting:   150 / 90  (left arm) Cuff size:   regular  Vitals Entered By: Levon Hedger (December 06, 2010 10:40 AM) CC: real bad leg cramps...dizziness...still not eating right, Depression Is Patient Diabetic? No Pain Assessment Patient in pain? no       Does patient need assistance? Functional Status Self care Ambulation Normal   CC:  real bad leg cramps...dizziness...still not eating right and Depression.  History of Present Illness:  Pt into the office with c/o cramps in her legs. started in December None present this week and the last one was present 2 weeks ago and last for 3 minutes. Not present when sleeping at night. Present only when she changes positions such as going from a standing to a sitting position. Pain is limited to the upper thigh. No radiation down the leg. Pt does sit with her legs crossed the majority of the time.  Left over right leg Drinks mostly sierra mist and water for hydration  Pt presnts today with all her medications. Indicates that she just picked up the medications last month - fosamax, calcium and remeron  Depression History:      The patient is having a depressed mood most of the day and has a diminished interest in her usual daily activities.         Depression Treatment History:  Prior Medication Used:   Start Date: Assessment of Effect:   Comments:  Celexa (citalopram)     --       --       pt stopped taking meds - no side effects Zoloft (sertraline)     01/08/2010   some improvement     started Wellbutrin (bupropion)     09/04/2009     --       pt stopped taking meds Remeron (mirtazapine)     --       --       taking to help with appetite   Habits &  Providers  Alcohol-Tobacco-Diet     Alcohol drinks/day: 0     Alcohol type: beer     Tobacco Status: current     Tobacco Counseling: to quit use of tobacco products     Cigarette Packs/Day: <0.25     Year Started: age 72  Exercise-Depression-Behavior     Does Patient Exercise: no     Depression Counseling: not indicated; screening negative for depression     Drug Use: yes  Current Medications (verified): 1)  Remeron 15 Mg Tabs (Mirtazapine) .Marland Kitchen.. 1 Tablet By Mouth Nightly For Mood and Appetite 2)  Calcium 600-D 600-400 Mg-Unit Tabs (Calcium Carbonate-Vitamin D) .Marland Kitchen.. 1 Tablet By Mouth Two Times A Day For Bones 3)  Fosamax 70 Mg Tabs (Alendronate Sodium) .... One Tablet By Mouth Weekly For Bones 4)  Sertraline Hcl 100 Mg Tabs (Sertraline Hcl) .... One Tablet By Mouth Nightly For Mood **note Increase Dose**  Allergies (verified): No Known Drug Allergies  Review of Systems General:  Complains of loss of appetite; denies fever. CV:  Denies fatigue. Resp:  Denies cough. GI:  Denies abdominal pain, nausea, and  vomiting.  Physical Exam  General:  alert.   Head:  normocephalic.   Ears:  ear piercing(s) noted.   Lungs:  normal breath sounds.   Heart:  normal rate and regular rhythm.   Msk:  up to the exam table Neurologic:  alert & oriented X3.   Skin:  color normal.   Psych:  Oriented X3.     Impression & Recommendations:  Problem # 1:  LEG CRAMPS (ICD-729.82) will check potassium today advised pt not to sit with legs crossed  Problem # 2:  ELEVATED BLOOD PRESSURE WITHOUT DIAGNOSIS OF HYPERTENSION (ICD-796.2) pt may meds will need to monitor diet DASH Orders: T-Comprehensive Metabolic Panel ([id number]) T-CBC w/Diff (25366-44034) Rapid HIV  (74259)  Problem # 3:  OSTEOPENIA (ICD-733.90) advise pt to continue taking meds Her updated medication list for this problem includes:    Calcium 600-d 600-400 Mg-unit Tabs (Calcium carbonate-vitamin d) .Marland Kitchen... 1 tablet by mouth  two times a day for bones    Fosamax 70 Mg Tabs (Alendronate sodium) ..... One tablet by mouth weekly for bones  Problem # 4:  TOBACCO ABUSE (ICD-305.1) advise cessation  Complete Medication List: 1)  Remeron 15 Mg Tabs (Mirtazapine) .Marland Kitchen.. 1 tablet by mouth nightly for mood and appetite 2)  Calcium 600-d 600-400 Mg-unit Tabs (Calcium carbonate-vitamin d) .Marland Kitchen.. 1 tablet by mouth two times a day for bones 3)  Fosamax 70 Mg Tabs (Alendronate sodium) .... One tablet by mouth weekly for bones 4)  Sertraline Hcl 100 Mg Tabs (Sertraline hcl) .... One tablet by mouth nightly for mood **note increase dose**  Other Orders: T-TSH ([id number])  Patient Instructions: 1)  Blood pressure - High today.  Will need to monitor. 2)  You may need medications if it keeps being high. 3)  Thank you for bring your medications into the office.  Keep taking them daily. 4)  Leg cramps - Do NOT cross your left leg over your right.  Doing this over time may be what is causing the cramp.  5)  Your labs will be checked today. 6)  Follow up as needed   Orders Added: 1)  Est. Patient Level III [29518] 2)  T-Comprehensive Metabolic Panel [[id number]] 3)  T-CBC w/Diff [[id number]] 4)  T-TSH [[id number]] 5)  Rapid HIV  [92370]    Laboratory Results  Date/Time Received: December 06, 2010 11:57 AM   Other Tests  Rapid HIV: negative

## 2010-12-13 NOTE — Progress Notes (Signed)
Summary: Query re med refill  Phone Note Refill Request   Refills Requested: Medication #1:  REMERON 15 MG TABS 1 tablet by mouth nightly for mood and appetite  Medication #2:  CALCIUM 600-D 600-400 MG-UNIT TABS 1 tablet by mouth two times a day for bones  Medication #3:  ACTONEL 35 MG TABS 1 tablet by mouth weekly for bones  Medication #4:  SERTRALINE HCL 100 MG TABS One tablet by mouth nightly for mood **note increase dose**. PT NEED A REFILL [place] [facility] PT CALL AND TOLD HER THAT SHE DON'T HAVE ANY REFILLS   Initial call taken by: Cheryll Dessert,  November 23, 2010 9:31 AM  Follow-up for Phone Call        Last seen 08/2010. Refill Remeron per protocol?  Last refill dated 08/2009 with 5 refills.  Dutch Quint RN  November 23, 2010 2:50 PM   Additional Follow-up for Phone Call Additional follow up Details #1::        yes ok to refill all meds including remeron Additional Follow-up by: Lehman Prom FNP,  November 23, 2010 3:17 PM    Additional Follow-up for Phone Call Additional follow up Details #2::    Noted. Refills completed.  Dutch Quint RN  November 23, 2010 3:36 PM   Prescriptions: SERTRALINE HCL 100 MG TABS (SERTRALINE HCL) One tablet by mouth nightly for mood **note increase dose**  #30 x 3   Entered by:   Dutch Quint RN   Authorized by:   Lehman Prom FNP   Signed by:   Dutch Quint RN on 11/23/2010   Method used:   Faxed to ...       [facility] - [facility] (retail)       [address] [place], [place]  [address]       Ph: [phone]       Fax: [phone]   RxID:   [id number] ACTONEL 35 MG TABS (RISEDRONATE SODIUM) 1 tablet by mouth weekly for bones  #4 x 3   Entered by:   Dutch Quint RN   Authorized by:   Lehman Prom FNP   Signed by:   Dutch Quint RN on 11/23/2010   Method used:   Faxed to ...       [facility] - [facility] (retail)       [address] [place], [place]   [address]       Ph: [phone]       Fax: [phone]   RxID:   [id number] CALCIUM 600-D 600-400 MG-UNIT TABS (CALCIUM CARBONATE-VITAMIN D) 1 tablet by mouth two times a day for bones  #60 x 3   Entered by:   Dutch Quint RN   Authorized by:   Lehman Prom FNP   Signed by:   Dutch Quint RN on 11/23/2010   Method used:   Faxed to ...       [facility] - Pharmac (retail)       [address] [place], [place]  [address]       Ph: [phone]       Fax: [phone]   RxID:   [id number] REMERON 15 MG TABS (MIRTAZAPINE) 1 tablet by mouth nightly for mood and appetite  #30 x 3   Entered by:   Dutch Quint RN   Authorized by:   Lehman Prom FNP  Signed by:   Dutch Quint RN on 11/23/2010   Method used:   Faxed to ...       [facility] - Pharmac (retail)       [address] [place], [place]  [address]       Ph: [phone]       Fax: [phone]   RxID:   [id number]

## 2010-12-27 ENCOUNTER — Encounter (INDEPENDENT_AMBULATORY_CARE_PROVIDER_SITE_OTHER): Payer: Self-pay | Admitting: Nurse Practitioner

## 2010-12-27 ENCOUNTER — Encounter: Payer: Self-pay | Admitting: Nurse Practitioner

## 2011-01-02 NOTE — Assessment & Plan Note (Signed)
Summary: BP recheck  Nurse Visit   Vital Signs:  Patient profile:   55 year old female Menstrual status:  postmenopausal Weight:      99.7 pounds Temp:     96.8 degrees F oral Pulse rate:   80 / minute Pulse rhythm:   regular Resp:     20 per minute BP sitting:   128 / 82  (right arm) Cuff size:   regular  Vitals Entered By: Dutch Quint RN (December 27, 2010 3:01 PM) CC: BP recheck Is Patient Diabetic? No Pain Assessment Patient in pain? no       Does patient need assistance? Functional Status Self care Ambulation Normal   CC:  BP recheck.  History of Present Illness: 12/06/10 BP 150/90 P88.  Was not started on BP meds at that time, given instructions to monitor diet, given DASH.  Goal is <135/85.   Is watching diet, monitoring salt intake.   Review of Systems CV:  Denies CP, SOB, dizziness, headache, visual changes, peripheral edema..   Patient Instructions: 1)  Your blood pressure is within normal limits and goal. 2)  Continue to monitor salt intake -- watch for "hidden" sources of sodium as we discussed. 3)  Keep scheduled appointment for your physical with provider. 4)  Call if anything changes or if you have any questions.   Physical Exam  General:  alert, well-developed, well-nourished, and well-hydrated.     Impression & Recommendations:  Problem # 1:  ELEVATED BLOOD PRESSURE WITHOUT DIAGNOSIS OF HYPERTENSION (ICD-796.2) BP within normal limits, goal Discussed "hidden" sources of sodium- read labels, continue to monitor Keep scheduled CPP with provider  Complete Medication List: 1)  Remeron 15 Mg Tabs (Mirtazapine) .Marland Kitchen.. 1 tablet by mouth nightly for mood and appetite 2)  Calcium 600-d 600-400 Mg-unit Tabs (Calcium carbonate-vitamin d) .Marland Kitchen.. 1 tablet by mouth two times a day for bones 3)  Fosamax 70 Mg Tabs (Alendronate sodium) .... One tablet by mouth weekly for bones 4)  Sertraline Hcl 100 Mg Tabs (Sertraline hcl) .... One tablet by mouth nightly  for mood **note increase dose**    Allergies: No Known Drug Allergies  Orders Added: 1)  Est. Patient Level I [81191]

## 2011-07-02 ENCOUNTER — Emergency Department (HOSPITAL_COMMUNITY)
Admission: EM | Admit: 2011-07-02 | Discharge: 2011-07-02 | Disposition: A | Discharge status: Home / Self Care | Payer: Self-pay | Attending: Emergency Medicine | Admitting: Emergency Medicine

## 2011-07-02 ENCOUNTER — Emergency Department (HOSPITAL_COMMUNITY): Payer: Self-pay

## 2011-07-02 DIAGNOSIS — M79609 Pain in unspecified limb: Secondary | ICD-10-CM | POA: Insufficient documentation

## 2011-07-02 DIAGNOSIS — S92919A Unspecified fracture of unspecified toe(s), initial encounter for closed fracture: Secondary | ICD-10-CM | POA: Insufficient documentation

## 2011-07-02 DIAGNOSIS — W2203XA Walked into furniture, initial encounter: Secondary | ICD-10-CM | POA: Insufficient documentation

## 2014-05-20 ENCOUNTER — Encounter (HOSPITAL_COMMUNITY): Payer: Self-pay | Admitting: Emergency Medicine

## 2014-05-20 ENCOUNTER — Emergency Department (INDEPENDENT_AMBULATORY_CARE_PROVIDER_SITE_OTHER)
Admission: EM | Admit: 2014-05-20 | Discharge: 2014-05-20 | Disposition: A | Discharge status: Home / Self Care | Payer: Self-pay | Source: Home / Self Care

## 2014-05-20 DIAGNOSIS — T733XXA Exhaustion due to excessive exertion, initial encounter: Secondary | ICD-10-CM

## 2014-05-20 DIAGNOSIS — M545 Low back pain, unspecified: Secondary | ICD-10-CM

## 2014-05-20 DIAGNOSIS — X501XXA Overexertion from prolonged static or awkward postures, initial encounter: Secondary | ICD-10-CM

## 2014-05-20 DIAGNOSIS — S39012A Strain of muscle, fascia and tendon of lower back, initial encounter: Secondary | ICD-10-CM

## 2014-05-20 DIAGNOSIS — M7918 Myalgia, other site: Secondary | ICD-10-CM

## 2014-05-20 DIAGNOSIS — S335XXA Sprain of ligaments of lumbar spine, initial encounter: Secondary | ICD-10-CM

## 2014-05-20 MED ORDER — TRAMADOL HCL 50 MG PO TABS: 50.0000 mg | ORAL_TABLET | Freq: Four times a day (QID) | ORAL | Status: DC | PRN

## 2014-05-20 MED ORDER — DICLOFENAC POTASSIUM 50 MG PO TABS: 50.0000 mg | ORAL_TABLET | Freq: Three times a day (TID) | ORAL | Status: DC

## 2014-05-20 NOTE — ED Provider Notes (Signed)
Medical screening examination/treatment/procedure(s) were performed by resident physician or non-physician practitioner and as supervising physician I was immediately available for consultation/collaboration.   Delynda Sepulveda DOUGLAS MD.   Jaidalyn Schillo D Maisa Bedingfield, MD 05/20/14 1130 

## 2014-05-20 NOTE — Discharge Instructions (Signed)
Back Pain, Adult Heat and stretches frequently Low back pain is very common. About 1 in 5 people have back pain.The cause of low back pain is rarely dangerous. The pain often gets better over time.About half of people with a sudden onset of back pain feel better in just 2 weeks. About 8 in 10 people feel better by 6 weeks.  CAUSES Some common causes of back pain include:  Strain of the muscles or ligaments supporting the spine.  Wear and tear (degeneration) of the spinal discs.  Arthritis.  Direct injury to the back. DIAGNOSIS Most of the time, the direct cause of low back pain is not known.However, back pain can be treated effectively even when the exact cause of the pain is unknown.Answering your caregiver's questions about your overall health and symptoms is one of the most accurate ways to make sure the cause of your pain is not dangerous. If your caregiver needs more information, he or she may order lab work or imaging tests (X-rays or MRIs).However, even if imaging tests show changes in your back, this usually does not require surgery. HOME CARE INSTRUCTIONS For many people, back pain returns.Since low back pain is rarely dangerous, it is often a condition that people can learn to [facility] their own.   Remain active. It is stressful on the back to sit or stand in one place. Do not sit, drive, or stand in one place for more than 30 minutes at a time. Take short walks on level surfaces as soon as pain allows.Try to increase the length of time you walk each day.  Do not stay in bed.Resting more than 1 or 2 days can delay your recovery.  Do not avoid exercise or work.Your body is made to move.It is not dangerous to be active, even though your back may hurt.Your back will likely heal faster if you return to being active before your pain is gone.  Pay attention to your body when you bend and lift. Many people have less discomfortwhen lifting if they bend their knees, keep the  load close to their bodies,and avoid twisting. Often, the most comfortable positions are those that put less stress on your recovering back.  Find a comfortable position to sleep. Use a firm mattress and lie on your side with your knees slightly bent. If you lie on your back, put a pillow under your knees.  Only take over-the-counter or prescription medicines as directed by your caregiver. Over-the-counter medicines to reduce pain and inflammation are often the most helpful.Your caregiver may prescribe muscle relaxant drugs.These medicines help dull your pain so you can more quickly return to your normal activities and healthy exercise.  Put ice on the injured area.  Put ice in a plastic bag.  Place a towel between your skin and the bag.  Leave the ice on for 15-20 minutes, 03-04 times a day for the first 2 to 3 days. After that, ice and heat may be alternated to reduce pain and spasms.  Ask your caregiver about trying back exercises and gentle massage. This may be of some benefit.  Avoid feeling anxious or stressed.Stress increases muscle tension and can worsen back pain.It is important to recognize when you are anxious or stressed and learn ways to manage it.Exercise is a great option. SEEK MEDICAL CARE IF:  You have pain that is not relieved with rest or medicine.  You have pain that does not improve in 1 week.  You have new symptoms.  You are generally  not feeling well. SEEK IMMEDIATE MEDICAL CARE IF:   You have pain that radiates from your back into your legs.  You develop new bowel or bladder control problems.  You have unusual weakness or numbness in your arms or legs.  You develop nausea or vomiting.  You develop abdominal pain.  You feel faint. Document Released: 10/28/2005 Document Revised: 04/28/2012 Document Reviewed: 03/18/2011 [facility] Patient Information 2015 [place], [place]. This information is not intended to replace advice given to you by your health  care provider. Make sure you discuss any questions you have with your health care provider.  Lumbosacral Strain Lumbosacral strain is a strain of any of the parts that make up your lumbosacral vertebrae. Your lumbosacral vertebrae are the bones that make up the lower third of your backbone. Your lumbosacral vertebrae are held together by muscles and tough, fibrous tissue (ligaments).  CAUSES  A sudden blow to your back can cause lumbosacral strain. Also, anything that causes an excessive stretch of the muscles in the low back can cause this strain. This is typically seen when people exert themselves strenuously, fall, lift heavy objects, bend, or crouch repeatedly. RISK FACTORS  Physically demanding work.  Participation in pushing or pulling sports or sports that require a sudden twist of the back (tennis, golf, baseball).  Weight lifting.  Excessive lower back curvature.  Forward-tilted pelvis.  Weak back or abdominal muscles or both.  Tight hamstrings. SIGNS AND SYMPTOMS  Lumbosacral strain may cause pain in the area of your injury or pain that moves (radiates) down your leg.  DIAGNOSIS Your health care provider can often diagnose lumbosacral strain through a physical exam. In some cases, you may need tests such as X-ray exams.  TREATMENT  Treatment for your lower back injury depends on many factors that your clinician will have to evaluate. However, most treatment will include the use of anti-inflammatory medicines. HOME CARE INSTRUCTIONS   Avoid hard physical activities (tennis, racquetball, waterskiing) if you are not in proper physical condition for it. This may aggravate or create problems.  If you have a back problem, avoid sports requiring sudden body movements. Swimming and walking are generally safer activities.  Maintain good posture.  Maintain a healthy weight.  For acute conditions, you may put ice on the injured area.  Put ice in a plastic bag.  Place a towel  between your skin and the bag.  Leave the ice on for 20 minutes, 2-3 times a day.  When the low back starts healing, stretching and strengthening exercises may be recommended. SEEK MEDICAL CARE IF:  Your back pain is getting worse.  You experience severe back pain not relieved with medicines. SEEK IMMEDIATE MEDICAL CARE IF:   You have numbness, tingling, weakness, or problems with the use of your arms or legs.  There is a change in bowel or bladder control.  You have increasing pain in any area of the body, including your belly (abdomen).  You notice shortness of breath, dizziness, or feel faint.  You feel sick to your stomach (nauseous), are throwing up (vomiting), or become sweaty.  You notice discoloration of your toes or legs, or your feet get very cold. MAKE SURE YOU:   Understand these instructions.  Will watch your condition.  Will get help right away if you are not doing well or get worse. Document Released: 08/07/2005 Document Revised: 11/02/2013 Document Reviewed: 06/16/2013 [facility] Patient Information 2015 [place]Care, [place]. This information is not intended to replace advice given to you by  your health care provider. Make sure you discuss any questions you have with your health care provider. ° °

## 2014-05-20 NOTE — ED Provider Notes (Signed)
CSN: [id number]     Arrival date & time 05/20/14  0803 History   First MD Initiated Contact with Patient 05/20/14 [phone]     Chief Complaint  Patient presents with  . 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 Four d ago began having pain across the lower back whic worsens longer she stands. No trauma, fall or injury. No focal weakness or paresthesias.   History reviewed. No pertinent past medical history. History reviewed. No pertinent past surgical history. No family history on file. History  Substance Use Topics  . Smoking status: Current Every Day Smoker  . Smokeless tobacco: Not on file  . Alcohol Use: Yes   OB History   Grav Para Term Preterm Abortions TAB SAB Ect Mult Living                 Review of Systems  Constitutional: Negative for fever, chills and activity change.  Respiratory: Negative.   Cardiovascular: Negative.   Musculoskeletal: Positive for back pain.       As per HPI  Skin: Negative for color change, pallor and rash.  Neurological: Negative.     Allergies  Review of patient's allergies indicates no known allergies.  Home Medications   Prior to Admission medications   Medication Sig Start Date End Date Taking? Authorizing Provider  BAYER ASPIRIN PO Take by mouth.   Yes Historical Provider, MD  Ibuprofen (ADVIL PO) Take by mouth.   Yes Historical Provider, MD  diclofenac (CATAFLAM) 50 MG tablet Take 1 tablet (50 mg total) by mouth 3 (three) times daily. One tablet TID with food prn pain. 05/20/14   Hayden Rasmussenavid Shakea Isip, NP  traMADol (ULTRAM) 50 MG tablet Take 1 tablet (50 mg total) by mouth every 6 (six) hours as needed. 05/20/14   Hayden Rasmussenavid Husein Guedes, NP   BP 119/82  Pulse 91  Temp(Src) 98.2 F (36.8 C) (Oral)  SpO2 97% Physical Exam  Nursing note and vitals reviewed. Constitutional: She is oriented to person, place, and time. She  appears well-developed and well-nourished. No distress.  HENT:  Head: Normocephalic and atraumatic.  Neck: Normal range of motion. Neck supple.  Cardiovascular: Normal rate, regular rhythm and normal heart sounds.   Pulmonary/Chest: Effort normal and breath sounds normal. No respiratory distress.  Musculoskeletal: Normal range of motion. She exhibits no edema.  Tenderness to the para lumbar musculature. Spasms palpable. No spinal tenderness or deformities. Able to flex spine but with some pain in the muscles.   Neurological: She is alert and oriented to person, place, and time. No cranial nerve deficit.  Skin: Skin is warm and dry.    ED Course  Procedures (including critical care time) Labs Review Labs Reviewed - No data to display  Imaging Review No results found.   MDM   1. Low back strain, initial encounter   2. Muscle pain, lumbar    Heat Stretches cataflam and tramadol Read instructions    Hayden Rasmussenavid Emmalia Heyboer, NP 05/20/14 [phone]

## 2014-05-20 NOTE — ED Notes (Signed)
Reports low back pain since Monday.  No known injury. No pain or tingling in either leg.

## 2020-11-06 ENCOUNTER — Ambulatory Visit (HOSPITAL_COMMUNITY)
Admission: EM | Admit: 2020-11-06 | Discharge: 2020-11-06 | Disposition: A | Discharge status: Home / Self Care | Payer: HRSA Program | Attending: Physician Assistant | Admitting: Physician Assistant

## 2020-11-06 ENCOUNTER — Other Ambulatory Visit: Payer: Self-pay

## 2020-11-06 ENCOUNTER — Ambulatory Visit (INDEPENDENT_AMBULATORY_CARE_PROVIDER_SITE_OTHER): Payer: HRSA Program

## 2020-11-06 ENCOUNTER — Encounter (HOSPITAL_COMMUNITY): Payer: Self-pay

## 2020-11-06 DIAGNOSIS — R0602 Shortness of breath: Secondary | ICD-10-CM

## 2020-11-06 DIAGNOSIS — Z20822 Contact with and (suspected) exposure to covid-19: Secondary | ICD-10-CM | POA: Diagnosis present

## 2020-11-06 DIAGNOSIS — R059 Cough, unspecified: Secondary | ICD-10-CM | POA: Diagnosis present

## 2020-11-06 DIAGNOSIS — U071 COVID-19: Secondary | ICD-10-CM | POA: Diagnosis not present

## 2020-11-06 LAB — SARS CORONAVIRUS 2 (TAT 6-24 HRS): SARS Coronavirus 2: POSITIVE — AB

## 2020-11-06 MED ORDER — ALBUTEROL SULFATE HFA 108 (90 BASE) MCG/ACT IN AERS: 2.0000 | INHALATION_SPRAY | RESPIRATORY_TRACT | 0 refills | Status: DC | PRN

## 2020-11-06 MED ORDER — AZITHROMYCIN 250 MG PO TABS: 250.0000 mg | ORAL_TABLET | Freq: Every day | ORAL | 0 refills | Status: DC

## 2020-11-06 NOTE — Discharge Instructions (Addendum)
Follow up with PCP for further evaluation of possible COPD Return if no improvement or with new or worsening symptoms.

## 2020-11-06 NOTE — ED Provider Notes (Signed)
[facility]    CSN: [id number] Arrival date & time: 11/06/20  1006      History   Chief Complaint Chief Complaint  Patient presents with   Cough   Nasal Congestion   Back Pain    HPI Sherry Douglas is a 64 y.o. female.   Patient here concerned with N/V, diarrhea, productive cough and congestion that has been going on for 3 days now.  Admits nasal congestion, rhinorrhea, chest wall pain. No sick contacts, no known exposures to COVID, she has had COVID vaccine.     History reviewed. No pertinent past medical history.  Patient Active Problem List   Diagnosis Date Noted   LEG CRAMPS 12/06/2010   HAIR LOSS 10/31/2009   ELEVATED BLOOD PRESSURE WITHOUT DIAGNOSIS OF HYPERTENSION 09/04/2009   DENTAL CARIES 12/06/2008   OSTEOPENIA 11/17/2008   ERYTHROCYTOSIS 10/24/2008   ACUTE CYSTITIS 10/24/2008   ADRENAL INSUFFICIENCY, HX OF 10/24/2008   POSTMENOPAUSAL STATUS 10/24/2008   HYPERKALEMIA 08/23/2008   TOBACCO ABUSE 08/23/2008   DEPRESSION 08/01/2008   WEIGHT LOSS 08/01/2008    History reviewed. No pertinent surgical history.  OB History   No obstetric history on file.      Home Medications    Prior to Admission medications   Medication Sig Start Date End Date Taking? Authorizing Provider  albuterol (VENTOLIN HFA) 108 (90 Base) MCG/ACT inhaler Inhale 2 puffs into the lungs every 4 (four) hours as needed for wheezing or shortness of breath. 11/06/20  Yes Evern Core, PA-C  azithromycin (ZITHROMAX) 250 MG tablet Take 1 tablet (250 mg total) by mouth daily. Take first 2 tablets together, then 1 every day until finished. 11/06/20  Yes Evern Core, PA-C  BAYER ASPIRIN PO Take by mouth.    [provider]  diclofenac (CATAFLAM) 50 MG tablet Take 1 tablet (50 mg total) by mouth 3 (three) times daily. One tablet TID with food prn pain. 05/20/14   Hayden Rasmussen, NP  Ibuprofen (ADVIL PO) Take by mouth.    [provider]   traMADol (ULTRAM) 50 MG tablet Take 1 tablet (50 mg total) by mouth every 6 (six) hours as needed. 05/20/14   Hayden Rasmussen, NP    Family History History reviewed. No pertinent family history.  Social History Social History   Tobacco Use   Smoking status: Current Every Day Smoker   Smokeless tobacco: Never Used  Substance Use Topics   Alcohol use: Yes   Drug use: No     Allergies   Patient has no known allergies.   Review of Systems Review of Systems  Constitutional: Positive for fatigue. Negative for chills and fever.  HENT: Positive for congestion and rhinorrhea. Negative for ear pain, nosebleeds, postnasal drip, sinus pressure, sinus pain and sore throat.   Eyes: Negative for pain and redness.  Respiratory: Positive for cough. Negative for shortness of breath and wheezing.   Gastrointestinal: Positive for nausea and vomiting. Negative for abdominal pain and diarrhea.  Musculoskeletal: Positive for arthralgias and myalgias.  Skin: Negative for rash.  Neurological: Negative for light-headedness and headaches.  Hematological: Negative for adenopathy. Does not bruise/bleed easily.  Psychiatric/Behavioral: Negative for confusion and sleep disturbance.     Physical Exam Triage Vital Signs ED Triage Vitals  Enc Vitals Group     BP 11/06/20 1231 (!) 148/90     Pulse Rate 11/06/20 1231 87     Resp 11/06/20 1231 17     Temp 11/06/20 1231 98.7 F (37.1 C)  Temp Source 11/06/20 1231 Oral     SpO2 11/06/20 1231 99 %     Weight --      Height --      Head Circumference --      Peak Flow --      Pain Score 11/06/20 1229 8     Pain Loc --      Pain Edu? --      Excl. in GC? --    No data found.  Updated Vital Signs BP (!) 148/90 (BP Location: Left Arm)    Pulse 87    Temp 98.7 F (37.1 C) (Oral)    Resp 17    SpO2 99%   Visual Acuity Right Eye Distance:   Left Eye Distance:   Bilateral Distance:    Right Eye Near:   Left Eye Near:    Bilateral Near:      Physical Exam Vitals and nursing note reviewed.  Constitutional:      General: She is not in acute distress.    Appearance: Normal appearance. She is well-developed. She is obese. She is not ill-appearing or toxic-appearing.  HENT:     Head: Normocephalic and atraumatic.     Right Ear: Tympanic membrane and ear canal normal. No swelling or tenderness. No middle ear effusion. Tympanic membrane is not injected, scarred, perforated, retracted or bulging.     Left Ear: Tympanic membrane and ear canal normal. No swelling or tenderness.  No middle ear effusion. Tympanic membrane is not injected, scarred, perforated, retracted or bulging.     Nose: Mucosal edema, congestion and rhinorrhea present. Rhinorrhea is clear.     Right Turbinates: Swollen. Not enlarged.     Left Turbinates: Swollen. Not enlarged.     Right Sinus: No frontal sinus tenderness.     Left Sinus: No maxillary sinus tenderness or frontal sinus tenderness.     Mouth/Throat:     Mouth: Mucous membranes are moist.     Pharynx: Oropharynx is clear. No pharyngeal swelling, oropharyngeal exudate, posterior oropharyngeal erythema or uvula swelling.     Tonsils: No tonsillar exudate or tonsillar abscesses. 0 on the right. 0 on the left.  Eyes:     Conjunctiva/sclera: Conjunctivae normal.  Cardiovascular:     Rate and Rhythm: Normal rate and regular rhythm.     Heart sounds: No murmur heard.   Pulmonary:     Effort: Pulmonary effort is normal. No respiratory distress.     Breath sounds: Rhonchi (b/l lower lobe) present. No wheezing.  Musculoskeletal:        General: Normal range of motion.     Cervical back: Neck supple.  Skin:    General: Skin is warm and dry.     Capillary Refill: Capillary refill takes less than 2 seconds.  Neurological:     General: No focal deficit present.     Mental Status: She is alert and oriented to person, place, and time.     Motor: No weakness.     Gait: Gait normal.  Psychiatric:         Mood and Affect: Mood normal.        Behavior: Behavior normal.      UC Treatments / Results  Labs (all labs ordered are listed, but only abnormal results are displayed) Labs Reviewed  SARS CORONAVIRUS 2 (TAT 6-24 HRS)    EKG   Radiology DG Chest 2 View  Result Date: 11/06/2020 CLINICAL DATA:  Chest pain shortness of breath, nausea  and vomiting, productive cough for 3 days. EXAM: CHEST - 2 VIEW COMPARISON:  None. FINDINGS: Heart size is within normal limits. Lungs are clear. Lungs are hyperexpanded. No pleural effusion or pneumothorax is seen. Vertebral body wedging within the midthoracic spine, of uncertain age but most likely chronic in the absence of recent injury or acute midline back pain. No acute appearing osseous abnormality. Atherosclerosis of the aortic arch. IMPRESSION: 1. No active cardiopulmonary disease. No evidence of pneumonia or pulmonary edema. 2. Hyperexpanded lungs indicating COPD. 3. Aortic atherosclerosis. Electronically Signed   By: Bary Richard M.D.   On: 11/06/2020 13:20    Procedures Procedures (including critical care time)  Medications Ordered in UC Medications - No data to display  Initial Impression / Assessment and Plan / UC Course  I have reviewed the triage vital signs and the nursing notes.  Pertinent labs & imaging results that were available during my care of the patient were reviewed by me and considered in my medical decision making (see chart for details).     Patient is everyday smoker, given chronic lung findings, she possibly has undiagnosed COPD. Will treat as COPD with acute exacerbation Follow up with PCP Strict ED precautions provided. Final Clinical Impressions(s) / UC Diagnoses   Final diagnoses:  Cough  SOB (shortness of breath)  Encounter for laboratory testing for COVID-19 virus     Discharge Instructions     Follow up with PCP for further evaluation of possible COPD     ED Prescriptions    Medication Sig  Dispense Auth. Provider   albuterol (VENTOLIN HFA) 108 (90 Base) MCG/ACT inhaler Inhale 2 puffs into the lungs every 4 (four) hours as needed for wheezing or shortness of breath. 18 g Evern Core, PA-C   azithromycin (ZITHROMAX) 250 MG tablet Take 1 tablet (250 mg total) by mouth daily. Take first 2 tablets together, then 1 every day until finished. 6 tablet Evern Core, PA-C     PDMP not reviewed this encounter.   Evern Core, PA-C 11/06/20 1331

## 2020-11-06 NOTE — ED Triage Notes (Signed)
Pt in with c/o N/V, diarrhea, productive cough and congestion that has been going on for 3 days now.  Pt has not had medication for sxs

## 2022-01-22 ENCOUNTER — Encounter (HOSPITAL_COMMUNITY): Payer: Self-pay | Admitting: Emergency Medicine

## 2022-01-22 ENCOUNTER — Ambulatory Visit (HOSPITAL_COMMUNITY)
Admission: EM | Admit: 2022-01-22 | Discharge: 2022-01-22 | Disposition: A | Discharge status: Home / Self Care | Payer: Self-pay | Attending: Nurse Practitioner | Admitting: Nurse Practitioner

## 2022-01-22 ENCOUNTER — Other Ambulatory Visit: Payer: Self-pay

## 2022-01-22 ENCOUNTER — Ambulatory Visit (INDEPENDENT_AMBULATORY_CARE_PROVIDER_SITE_OTHER): Payer: Self-pay

## 2022-01-22 DIAGNOSIS — S82035A Nondisplaced transverse fracture of left patella, initial encounter for closed fracture: Secondary | ICD-10-CM

## 2022-01-22 MED ORDER — IBUPROFEN 600 MG PO TABS
600.0000 mg | ORAL_TABLET | Freq: Three times a day (TID) | ORAL | 0 refills | Status: AC | PRN
Start: 2022-01-22 — End: 2022-02-01

## 2022-01-22 NOTE — Discharge Instructions (Addendum)
Take medication as prescribed. ?Wear knee immobilizer until seen by Orthopedics. ?RICE therapy, rest, ice, compression, and elevation. ?You will need to follow-up with [facility] [phone]) or [facility] [phone]) for follow-up this week. ?Follow-up as needed.  ?

## 2022-01-22 NOTE — ED Provider Notes (Signed)
?[place] ? ? ? ?CSN: [id number] ?Arrival date & time: 01/22/22  1025 ? ? ?  ? ?History   ?Chief Complaint ?Chief Complaint  ?Patient presents with  ? Knee Injury  ? ? ?HPI ?Sherry Douglas is a 66 y.o. female.  ? ?Sherry Douglas is a pleasant 66 year old female who presents with left knee pain and swelling.  Symptoms started approximately 3 days ago after she hit the left knee on a table.  Since that time she has had worsening pain and swelling.  She is able to bear weight, but has pain.  Reports that the pain in the left knee radiates up into the left thigh.  She denies any numbness or tingling.  She has been taking ibuprofen for her pain.  She denies any previous injury to the left knee.  Patient works at [employer] and walks a lot on her job. ? ? ?History reviewed. No pertinent past medical history. ? ?Patient Active Problem List  ? Diagnosis Date Noted  ? LEG CRAMPS 12/06/2010  ? HAIR LOSS 10/31/2009  ? ELEVATED BLOOD PRESSURE WITHOUT DIAGNOSIS OF HYPERTENSION 09/04/2009  ? DENTAL CARIES 12/06/2008  ? OSTEOPENIA 11/17/2008  ? ERYTHROCYTOSIS 10/24/2008  ? ACUTE CYSTITIS 10/24/2008  ? ADRENAL INSUFFICIENCY, HX OF 10/24/2008  ? POSTMENOPAUSAL STATUS 10/24/2008  ? HYPERKALEMIA 08/23/2008  ? TOBACCO ABUSE 08/23/2008  ? DEPRESSION 08/01/2008  ? WEIGHT LOSS 08/01/2008  ? ? ?History reviewed. No pertinent surgical history. ? ?OB History   ?No obstetric history on file. ?  ? ? ? ?Home Medications   ? ?Prior to Admission medications   ?Medication Sig Start Date End Date Taking? Authorizing Provider  ?ibuprofen (ADVIL) 600 MG tablet Take 1 tablet (600 mg total) by mouth every 8 (eight) hours as needed for up to 10 days for moderate pain. 01/22/22 02/01/22 Yes Hatem Cull-Warren, Alda Lea, NP  ?albuterol (VENTOLIN HFA) 108 (90 Base) MCG/ACT inhaler Inhale 2 puffs into the lungs every 4 (four) hours as needed for wheezing or shortness of breath. 11/06/20   Peri Jefferson, PA-C  ?azithromycin (ZITHROMAX) 250 MG tablet Take  1 tablet (250 mg total) by mouth daily. Take first 2 tablets together, then 1 every day until finished. 11/06/20   Peri Jefferson, PA-C  ?BAYER ASPIRIN PO Take by mouth.    [provider]  ?diclofenac (CATAFLAM) 50 MG tablet Take 1 tablet (50 mg total) by mouth 3 (three) times daily. One tablet TID with food prn pain. 05/20/14   Janne Napoleon, NP  ?traMADol (ULTRAM) 50 MG tablet Take 1 tablet (50 mg total) by mouth every 6 (six) hours as needed. 05/20/14   Janne Napoleon, NP  ? ? ?Family History ?History reviewed. No pertinent family history. ? ?Social History ?Social History  ? ?Tobacco Use  ? Smoking status: Every Day  ? Smokeless tobacco: Never  ?Substance Use Topics  ? Alcohol use: Yes  ? Drug use: No  ? ? ? ?Allergies   ?Patient has no known allergies. ? ? ?Review of Systems ?Review of Systems  ?Constitutional: Negative.   ?Musculoskeletal:  Positive for joint swelling (left knee).  ?Skin: Negative.   ?Psychiatric/Behavioral: Negative.    ? ? ?Physical Exam ?Triage Vital Signs ?ED Triage Vitals  ?Enc Vitals Group  ?   BP 01/22/22 1109 135/72  ?   Pulse Rate 01/22/22 1109 (!) 103  ?   Resp 01/22/22 1109 18  ?   Temp 01/22/22 1109 98.3 ?F (36.8 ?C)  ?   Temp  Source 01/22/22 1109 Oral  ?   SpO2 01/22/22 1109 96 %  ?   Weight 01/22/22 1110 99 lb 10.4 oz (45.2 kg)  ?   Height 01/22/22 1110 5\' 2"  (1.575 m)  ?   Head Circumference --   ?   Peak Flow --   ?   Pain Score 01/22/22 1110 8  ?   Pain Loc --   ?   Pain Edu? --   ?   Excl. in GC? --   ? ?No data found. ? ?Updated Vital Signs ?BP 135/72 (BP Location: Left Arm)   Pulse (!) 103   Temp 98.3 ?F (36.8 ?C) (Oral)   Resp 18   Ht 5\' 2"  (1.575 m)   Wt 99 lb 10.4 oz (45.2 kg)   SpO2 96%   BMI 18.23 kg/m?  ? ?Visual Acuity ?Right Eye Distance:   ?Left Eye Distance:   ?Bilateral Distance:   ? ?Right Eye Near:   ?Left Eye Near:    ?Bilateral Near:    ? ?Physical Exam ?Vitals reviewed.  ?Constitutional:   ?   General: She is not in acute distress. ?HENT:  ?    Head: Normocephalic and atraumatic.  ?Eyes:  ?   Extraocular Movements: Extraocular movements intact.  ?   Conjunctiva/sclera: Conjunctivae normal.  ?   Pupils: Pupils are equal, round, and reactive to light.  ?Cardiovascular:  ?   Rate and Rhythm: Regular rhythm. Tachycardia present.  ?   Pulses: Normal pulses.  ?   Heart sounds: Normal heart sounds.  ?Pulmonary:  ?   Effort: Pulmonary effort is normal.  ?   Breath sounds: Normal breath sounds.  ?Musculoskeletal:  ?   Cervical back: Normal range of motion.  ?   Left upper leg: Normal.  ?   Left knee: Swelling and erythema present. Decreased range of motion. Tenderness present over the medial joint line and lateral joint line. Normal pulse.  ?   Left lower leg: Normal. No swelling. No edema.  ?Neurological:  ?   Mental Status: She is alert and oriented to person, place, and time.  ?Psychiatric:     ?   Mood and Affect: Mood normal.     ?   Behavior: Behavior normal.  ? ? ? ?UC Treatments / Results  ?Labs ?(all labs ordered are listed, but only abnormal results are displayed) ?Labs Reviewed - No data to display ? ?EKG ? ? ?Radiology ?DG Knee Complete 4 Views Left ? ?Result Date: 01/22/2022 ?CLINICAL DATA:  Left knee injury 3 days prior with pain and swelling EXAM: LEFT KNEE - COMPLETE 4+ VIEW COMPARISON:  None. FINDINGS: Nondisplaced transverse mid to lower patella fracture with surrounding soft tissue swelling. Trace suprapatellar left knee joint effusion. No additional fracture. No dislocation. No focal osseous lesions. No radiopaque foreign bodies. IMPRESSION: Nondisplaced transverse mid to lower left patella fracture with surrounding soft tissue swelling. Trace suprapatellar left knee joint effusion. Electronically Signed   By: Delbert Phenix M.D.   On: 01/22/2022 11:51   ? ?Procedures ?Procedures (including critical care time) ? ?Medications Ordered in UC ?Medications - No data to display ? ?Initial Impression / Assessment and Plan / UC Course  ?I have reviewed the  triage vital signs and the nursing notes. ? ?Pertinent labs & imaging results that were available during my care of the patient were reviewed by me and considered in my medical decision making (see chart for details). ? ?Patient presents with left knee  pain and swelling after hit it on a table approximately 3 to 4 days ago.  Since that time she has had pain, swelling, and difficulty weightbearing.  She does have some pain that radiates into the left thigh.  Imaging was performed which showed a nondisplaced patellar fracture. We will provide the patient with a knee immobilizer and crutches to decrease weight bearing. Recommend RICE therapy for the next several days.  We will also provide a prescription for ibuprofen 600 mg to help with pain and inflammation until patient can follow-up with ortho. Patient provided information for [facility] and [facility] for follow-up within the week.. ? ?Final Clinical Impressions(s) / UC Diagnoses  ? ?Final diagnoses:  ?Closed nondisplaced transverse fracture of left patella, initial encounter  ? ? ? ?Discharge Instructions   ? ?  ?Take medication as prescribed. ?RICE therapy, rest, ice, compression, and elevation. ?You will need to follow-up with [facility] [phone]) or [facility] [phone]) for follow-up this week. ?Follow-up as needed.  ? ? ? ? ?ED Prescriptions   ? ? Medication Sig Dispense Auth. Provider  ? ibuprofen (ADVIL) 600 MG tablet Take 1 tablet (600 mg total) by mouth every 8 (eight) hours as needed for up to 10 days for moderate pain. 30 tablet Macedonio Scallon-Warren, Alda Lea, NP  ? ?  ? ?PDMP not reviewed this encounter. ?  ?Tish Men, NP ?01/22/22 1243 ? ?

## 2022-01-22 NOTE — ED Triage Notes (Signed)
Pt reports hitting her left knee against something on Saturday. States knee pain and swelling has increased since then.  ?

## 2022-01-28 ENCOUNTER — Encounter: Payer: Self-pay | Admitting: Physician Assistant

## 2022-01-28 ENCOUNTER — Other Ambulatory Visit: Payer: Self-pay

## 2022-01-28 ENCOUNTER — Ambulatory Visit (INDEPENDENT_AMBULATORY_CARE_PROVIDER_SITE_OTHER): Payer: Self-pay | Admitting: Physician Assistant

## 2022-01-28 DIAGNOSIS — S82002D Unspecified fracture of left patella, subsequent encounter for closed fracture with routine healing: Secondary | ICD-10-CM

## 2022-01-28 NOTE — Progress Notes (Signed)
? ?  Office Visit Note ?  ?Patient: Sherry Douglas           ?Date of Birth: 1956/01/12           ?MRN: [id number] ?Visit Date: 01/28/2022 ?             ?Requested by: No referring provider defined for this encounter. ?PCP: Patient, No Pcp Per (Inactive) ? ? ?Assessment & Plan: ?Visit Diagnoses:  ?1. Closed nondisplaced fracture of left patella with routine healing, unspecified fracture morphology, subsequent encounter   ? ? ?Plan: She will keep the knee straight.  She is weightbearing as tolerated in the knee brace.  She is given a prescription for hinged Bledsoe brace.  This will be locked out at full extension.  Follow-up with [place] in 2 weeks lateral view of the left knee at that time.  Questions were encouraged and answered at length.  Discussed with her that this will take approximately 8 to 12 weeks to completely heal.  Questions were encouraged and answered.  She will take Tylenol for pain and stop the ibuprofen.  She does not want any pain medication. ? ?Follow-Up Instructions: Return in about 2 weeks (around 02/11/2022) for Radiographs.  ? ?Orders:  ?No orders of the defined types were placed in this encounter. ? ?No orders of the defined types were placed in this encounter. ? ? ? ? Procedures: ?No procedures performed ? ? ?Clinical Data: ?No additional findings. ? ? ?Subjective: ?Chief Complaint  ?Patient presents with  ? Left Knee - Pain  ? ? ?HPI ?[name] 66 year old female were seen for the first time for left knee pain.  She states she hit her knee on a table 6 days ago.  She was seen in the ER and found to have a nondisplaced mid patella transverse fracture.  She is placed appropriately in a knee immobilizer.  She is using crutches to ambulate.  She states she has been taking ibuprofen for pain.  She denies any other injury at the time of the knee injury. ?Review of Systems ?See HPI otherwise negative ? ?Objective: ?Vital Signs: There were no vitals taken for this visit. ? ?Physical Exam ?General  well-developed well-nourished female in no acute distress. ?Psych: Alert and oriented x3 ?Ortho Exam ?Left knee peripatellar tenderness.  No abnormal warmth erythema or effusion.  Calf supple nontender.  No attempts of flexion made. ?Specialty Comments:  ?No specialty comments available. ? ?Imaging: ?No results found. ? ? ?PMFS History: ?Patient Active Problem List  ? Diagnosis Date Noted  ? LEG CRAMPS 12/06/2010  ? HAIR LOSS 10/31/2009  ? ELEVATED BLOOD PRESSURE WITHOUT DIAGNOSIS OF HYPERTENSION 09/04/2009  ? DENTAL CARIES 12/06/2008  ? OSTEOPENIA 11/17/2008  ? ERYTHROCYTOSIS 10/24/2008  ? ACUTE CYSTITIS 10/24/2008  ? ADRENAL INSUFFICIENCY, HX OF 10/24/2008  ? POSTMENOPAUSAL STATUS 10/24/2008  ? HYPERKALEMIA 08/23/2008  ? TOBACCO ABUSE 08/23/2008  ? DEPRESSION 08/01/2008  ? WEIGHT LOSS 08/01/2008  ? ?History reviewed. No pertinent past medical history.  ?History reviewed. No pertinent family history.  ?History reviewed. No pertinent surgical history. ?Social History  ? ?Occupational History  ? Not on file  ?Tobacco Use  ? Smoking status: Every Day  ? Smokeless tobacco: Never  ?Substance and Sexual Activity  ? Alcohol use: Yes  ? Drug use: No  ? Sexual activity: Not on file  ? ? ? ? ? ? ?

## 2022-02-11 ENCOUNTER — Ambulatory Visit (INDEPENDENT_AMBULATORY_CARE_PROVIDER_SITE_OTHER): Payer: Self-pay | Admitting: Physician Assistant

## 2022-02-11 ENCOUNTER — Ambulatory Visit (INDEPENDENT_AMBULATORY_CARE_PROVIDER_SITE_OTHER): Payer: Self-pay

## 2022-02-11 ENCOUNTER — Encounter: Payer: Self-pay | Admitting: Physician Assistant

## 2022-02-11 DIAGNOSIS — S82002D Unspecified fracture of left patella, subsequent encounter for closed fracture with routine healing: Secondary | ICD-10-CM

## 2022-02-11 NOTE — Progress Notes (Signed)
? ?Office Visit Note ?  ?Patient: Sherry Douglas           ?Date of Birth: [birth year]           ?MRN: [id number] ?Visit Date: 02/11/2022 ?             ?Requested by: No referring provider defined for this encounter. ?PCP: Patient, No Pcp Per (Inactive) ? ? ?Assessment & Plan: ?Visit Diagnoses:  ?1. Closed nondisplaced fracture of left patella with routine healing, unspecified fracture morphology, subsequent encounter   ? ? ?Plan: She will continue the knee immobilizer to keep the knee straight.  This point time due to the patient did not get Bledsoe brace I feel she tolerated knee immobilizer well and can continue with this.  We will see her back in 4 weeks obtain a lateral view of the left knee at that time.  Questions were encouraged and answered at length.  She is out of work currently. ? ?Follow-Up Instructions: Return in about 4 weeks (around 03/11/2022) for Radiographs.  ? ?Orders:  ?Orders Placed This Encounter  ?Procedures  ? XR Knee 1-2 Views Left  ? ?No orders of the defined types were placed in this encounter. ? ? ? ? Procedures: ?No procedures performed ? ? ?Clinical Data: ?No additional findings. ? ? ?Subjective: ?Chief Complaint  ?Patient presents with  ? Left Knee - Fracture, Follow-up  ? ? ?HPI ?[name] returns today for follow-up of her left patella fracture.  She states she is overall doing a little better.  Again she sustained a left patella fracture on 01/22/2022 is nondisplaced transverse fracture.  She has been in knee immobilizer she did not utilize her brace even though she was given a prescription. ? ?Review of Systems ?See HPI otherwise negative ? ?Objective: ?Vital Signs: There were no vitals taken for this visit. ? ?Physical Exam ?Constitutional:   ?   Appearance: She is normal weight. She is not ill-appearing or diaphoretic.  ?Pulmonary:  ?   Effort: Pulmonary effort is normal.  ?Neurological:  ?   Mental Status: She is alert.  ?Psychiatric:     ?   Mood and Affect: Mood normal.  ? ? ?Ortho  Exam ?Left knee tenderness over the patella.  No effusion abnormal warmth erythema.  Left calf supple nontender. ?Specialty Comments:  ?No specialty comments available. ? ?Imaging: ?XR Knee 1-2 Views Left ? ?Result Date: 02/11/2022 ?AP lateral view left knee: Shows patella be well located.'s been no change in overall position alignment of the transverse fracture involving the patella.  There is early signs of sclerotic changes.  No other fractures identified.  ? ? ?PMFS History: ?Patient Active Problem List  ? Diagnosis Date Noted  ? LEG CRAMPS 12/06/2010  ? HAIR LOSS 10/31/2009  ? ELEVATED BLOOD PRESSURE WITHOUT DIAGNOSIS OF HYPERTENSION 09/04/2009  ? DENTAL CARIES 12/06/2008  ? OSTEOPENIA 11/17/2008  ? ERYTHROCYTOSIS 10/24/2008  ? ACUTE CYSTITIS 10/24/2008  ? ADRENAL INSUFFICIENCY, HX OF 10/24/2008  ? POSTMENOPAUSAL STATUS 10/24/2008  ? HYPERKALEMIA 08/23/2008  ? TOBACCO ABUSE 08/23/2008  ? DEPRESSION 08/01/2008  ? WEIGHT LOSS 08/01/2008  ? ?History reviewed. No pertinent past medical history.  ?History reviewed. No pertinent family history.  ?History reviewed. No pertinent surgical history. ?Social History  ? ?Occupational History  ? Not on file  ?Tobacco Use  ? Smoking status: Every Day  ? Smokeless tobacco: Never  ?Substance and Sexual Activity  ? Alcohol use: Yes  ? Drug use: No  ? Sexual activity:  Not on file  ? ? ? ? ? ? ?

## 2022-02-25 ENCOUNTER — Ambulatory Visit (INDEPENDENT_AMBULATORY_CARE_PROVIDER_SITE_OTHER): Payer: Self-pay | Admitting: Physician Assistant

## 2022-02-25 ENCOUNTER — Encounter: Payer: Self-pay | Admitting: Physician Assistant

## 2022-02-25 DIAGNOSIS — S82002D Unspecified fracture of left patella, subsequent encounter for closed fracture with routine healing: Secondary | ICD-10-CM

## 2022-02-26 NOTE — Progress Notes (Signed)
Patient not seen.

## 2022-03-11 ENCOUNTER — Encounter: Payer: Self-pay | Admitting: Physician Assistant

## 2022-03-11 ENCOUNTER — Ambulatory Visit (INDEPENDENT_AMBULATORY_CARE_PROVIDER_SITE_OTHER): Payer: Self-pay

## 2022-03-11 ENCOUNTER — Ambulatory Visit (INDEPENDENT_AMBULATORY_CARE_PROVIDER_SITE_OTHER): Payer: Self-pay | Admitting: Physician Assistant

## 2022-03-11 DIAGNOSIS — S82002D Unspecified fracture of left patella, subsequent encounter for closed fracture with routine healing: Secondary | ICD-10-CM

## 2022-03-11 NOTE — Progress Notes (Signed)
HPI: [name] returns today 1 day shy of 7 weeks status post sustaining a left patella fracture.  She states overall she is doing well.  She has been been using 1 crutch when out of the house.  She continues to use her knee immobilizer.  Still has some pain in the knee.  Taking no medications for the pain.  No new injuries. ? ?Physical exam: Left knee she has full extension actively flexion to 90 degrees actively.  No abnormal warmth erythema or effusion of the left knee. ? ?Radiographs: Left knee ?Lateral view shows further consolidation of a nondisplaced patella fracture.  Otherwise knee is well located.  No acute findings otherwise. ? ?Impression: Left patella fracture nondisplaced ? ?Plan: She can discontinue the knee immobilizer at this point time.  Weightbearing as tolerated.  She will work on gentle range of motion of the elbow.  No new deep squats lunges for exercise.  She can return to work full duties in 2 weeks.  Like to see her back in 1 month for final x-rays and hopefully last visit just to confirm that the fractures completely healed.  Questions were encouraged and answered at length today. ?

## 2022-04-10 ENCOUNTER — Ambulatory Visit (INDEPENDENT_AMBULATORY_CARE_PROVIDER_SITE_OTHER): Payer: Self-pay | Admitting: Orthopaedic Surgery

## 2022-04-10 ENCOUNTER — Ambulatory Visit (INDEPENDENT_AMBULATORY_CARE_PROVIDER_SITE_OTHER): Payer: Self-pay

## 2022-04-10 ENCOUNTER — Encounter: Payer: Self-pay | Admitting: Orthopaedic Surgery

## 2022-04-10 DIAGNOSIS — S82002D Unspecified fracture of left patella, subsequent encounter for closed fracture with routine healing: Secondary | ICD-10-CM

## 2022-04-10 NOTE — Progress Notes (Signed)
The patient is right close to 12 weeks status post a left nondisplaced patella fracture.  She has been weightbearing as tolerated ambulate with a crutch.  She is 66 years old.  She actually went back to work at the [employer] this past Saturday.  She states she is doing better overall and just has some aching of that knee.  Her station and flexion is full of the left knee on my exam.  There is minimal discomfort with compression on the patella.  Tracks normally.  There is no significant crepitation.  An AP and lateral left knee show that the patella fracture is healed completely and is in normal alignment.  At this point I did show her quad strengthening exercises to try and offered her physical therapy but she says she can do this on her own.  She is back to work.  Follow-up is as needed.

## 2022-04-12 ENCOUNTER — Telehealth: Payer: Self-pay | Admitting: Physician Assistant

## 2022-04-12 NOTE — Telephone Encounter (Signed)
Pt called requesting another letter for return to work. Pt states she lost her return to work letter for work date return back Mar 25, 2022. Pt is asking for a copy of same letter with return to work Mar 25, 2022. Pt replaced it. Don't see letter on pt's chart. Please call pt when ready for pick up. Pt phone number is [phone].

## 2022-04-12 NOTE — Telephone Encounter (Signed)
Note done and placed at the front desk. Pt informed

## 2022-04-12 NOTE — Telephone Encounter (Signed)
Ok for note 

## 2023-01-16 ENCOUNTER — Encounter: Payer: Self-pay | Admitting: Radiology

## 2023-06-02 IMAGING — DX DG KNEE COMPLETE 4+V*L*
4 series · 4 of 4 positions shown · non-contrast
Comparison: None.

CLINICAL DATA: Left knee injury 3 days prior with pain and swelling

EXAM:
LEFT KNEE - COMPLETE 4+ VIEW

[knee ap]
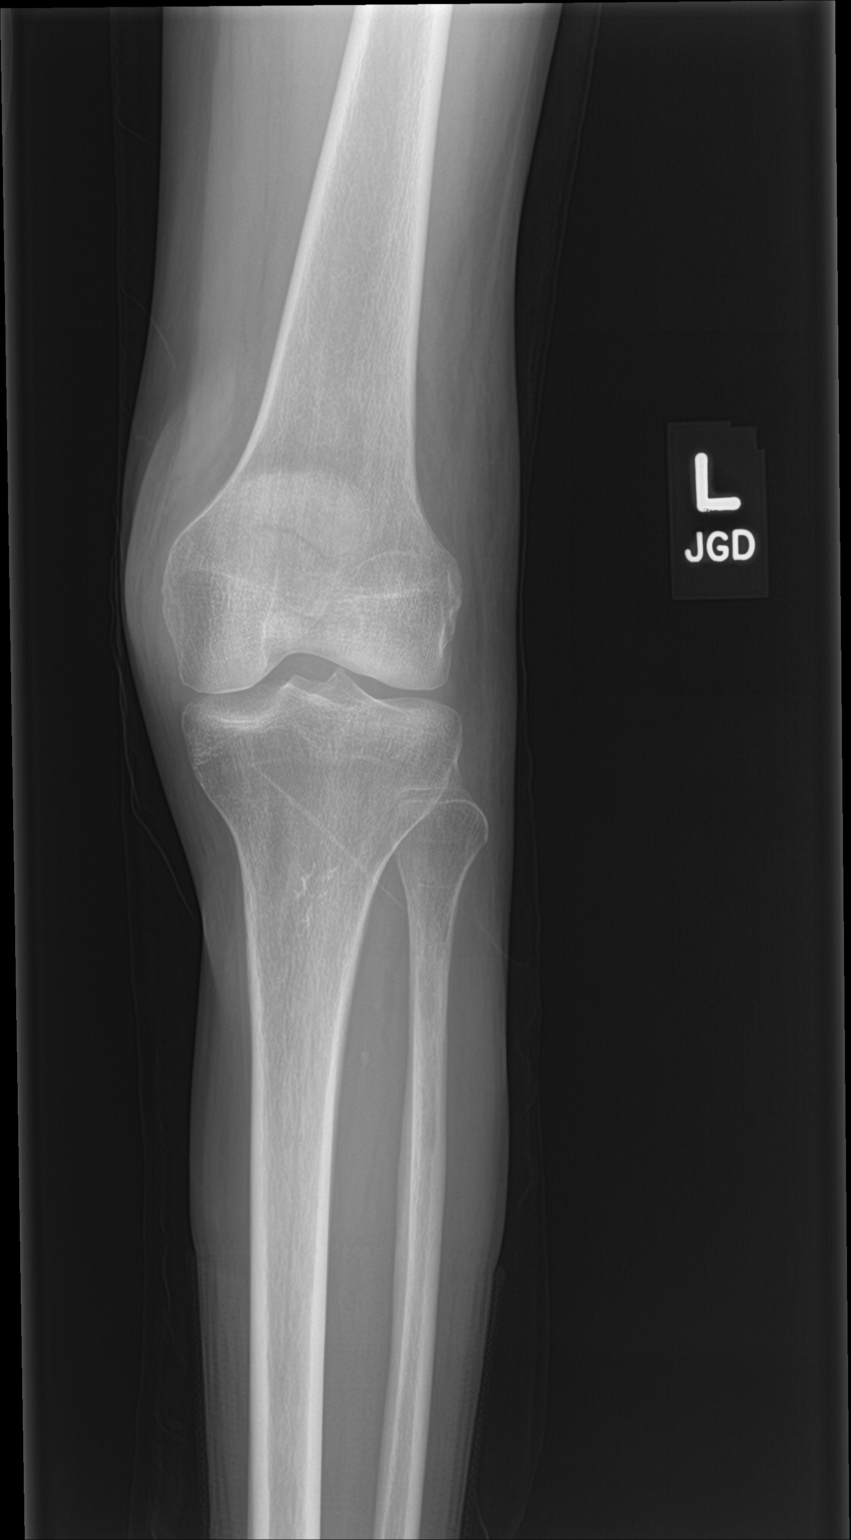

[knee obl (1 of 2)]
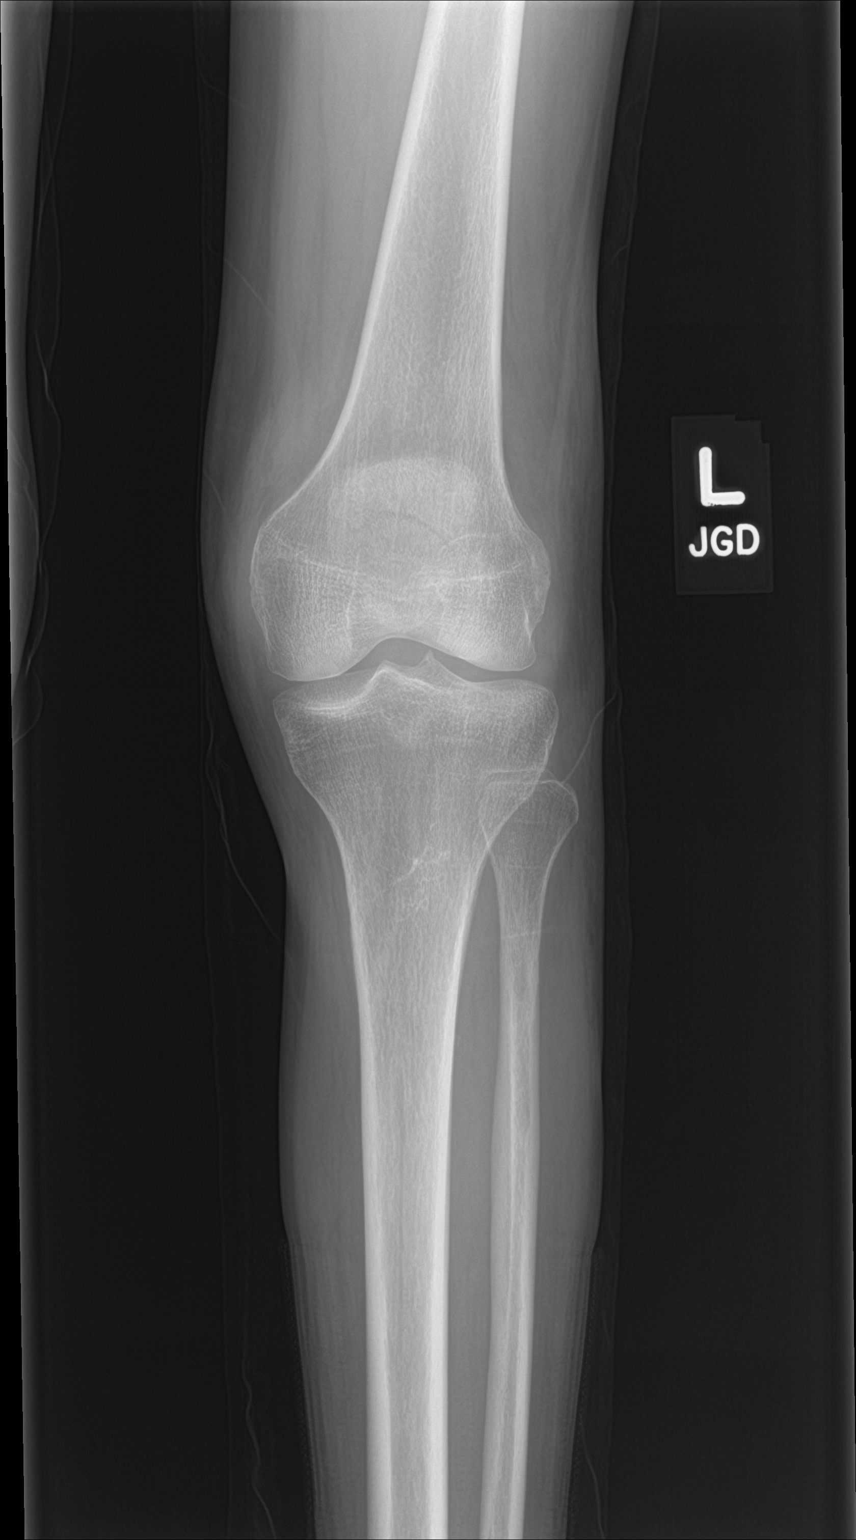

[knee obl (2 of 2)]
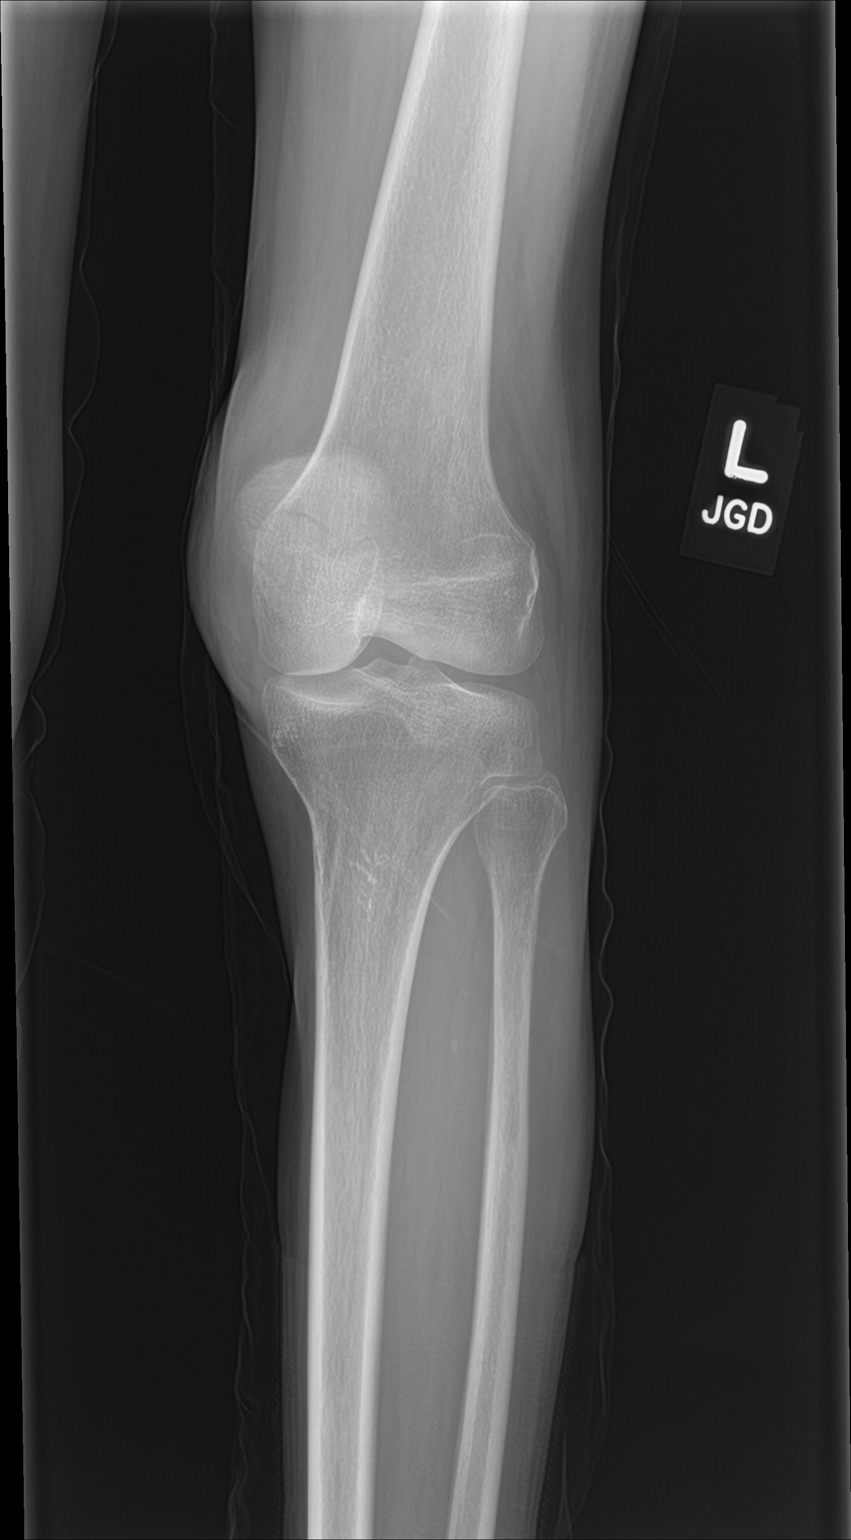

[knee lat]
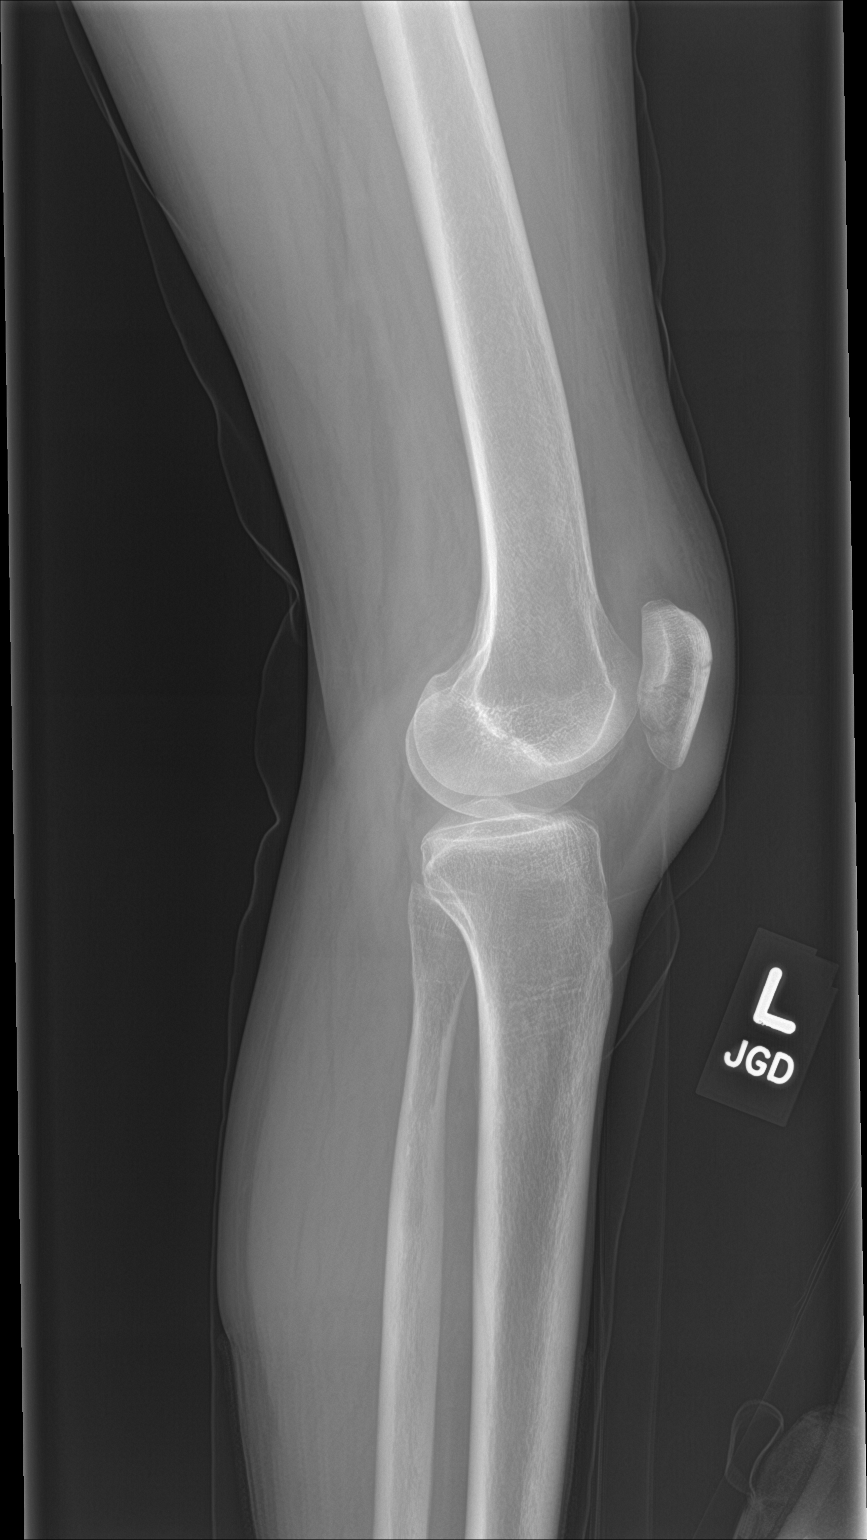

[4 of 4 positions shown; findings below may reference images not displayed]

FINDINGS: Nondisplaced transverse mid to lower patella fracture with
surrounding soft tissue swelling. Trace suprapatellar left knee
joint effusion. No additional fracture. No dislocation. No focal
osseous lesions. No radiopaque foreign bodies.
IMPRESSION: Nondisplaced transverse mid to lower left patella fracture with
surrounding soft tissue swelling. Trace suprapatellar left knee
joint effusion.

## 2023-07-25 ENCOUNTER — Encounter (HOSPITAL_COMMUNITY): Payer: Self-pay

## 2023-07-25 ENCOUNTER — Ambulatory Visit (HOSPITAL_COMMUNITY)
Admission: EM | Admit: 2023-07-25 | Discharge: 2023-07-25 | Disposition: A | Discharge status: Home / Self Care | Payer: 59 | Attending: Emergency Medicine | Admitting: Emergency Medicine

## 2023-07-25 DIAGNOSIS — M25562 Pain in left knee: Secondary | ICD-10-CM

## 2023-07-25 DIAGNOSIS — M5432 Sciatica, left side: Secondary | ICD-10-CM

## 2023-07-25 DIAGNOSIS — G8929 Other chronic pain: Secondary | ICD-10-CM

## 2023-07-25 MED ORDER — DEXAMETHASONE SODIUM PHOSPHATE 10 MG/ML IJ SOLN
10.0000 mg | Freq: Once | INTRAMUSCULAR | Status: AC
  Administered 2023-07-25: 10 mg via INTRAMUSCULAR

## 2023-07-25 MED ORDER — DEXAMETHASONE SODIUM PHOSPHATE 10 MG/ML IJ SOLN
INTRAMUSCULAR | Status: AC
  Filled 2023-07-25: qty 1

## 2023-07-25 MED ORDER — METHOCARBAMOL 500 MG PO TABS: 500.0000 mg | ORAL_TABLET | Freq: Two times a day (BID) | ORAL | 0 refills | Status: AC

## 2023-07-25 NOTE — ED Triage Notes (Signed)
Pt states left leg pain and lower back pain for the past week. States she has been taking ibuprofen at home for the pain with some relief.

## 2023-07-25 NOTE — ED Provider Notes (Signed)
[facility]    CSN: [id number] Arrival date & time: 07/25/23  7253      History   Chief Complaint Chief Complaint  Patient presents with   Leg Pain    HPI Sherry Douglas is a 67 y.o. female.   Patient presents with back pain that radiates to left leg x 1 week. Patient denies known injury. Patient also endorses chronic left knee pain from fracture a few years ago. Patient reports taking ibuprofen with some relief.   Leg Pain Associated symptoms: back pain   Associated symptoms: no fatigue, no fever and no neck pain     History reviewed. No pertinent past medical history.  Patient Active Problem List   Diagnosis Date Noted   LEG CRAMPS 12/06/2010   HAIR LOSS 10/31/2009   ELEVATED BLOOD PRESSURE WITHOUT DIAGNOSIS OF HYPERTENSION 09/04/2009   DENTAL CARIES 12/06/2008   OSTEOPENIA 11/17/2008   ERYTHROCYTOSIS 10/24/2008   ACUTE CYSTITIS 10/24/2008   ADRENAL INSUFFICIENCY, HX OF 10/24/2008   POSTMENOPAUSAL STATUS 10/24/2008   HYPERKALEMIA 08/23/2008   TOBACCO ABUSE 08/23/2008   DEPRESSION 08/01/2008   WEIGHT LOSS 08/01/2008    History reviewed. No pertinent surgical history.  OB History   No obstetric history on file.      Home Medications    Prior to Admission medications   Medication Sig Start Date End Date Taking? Authorizing Provider  methocarbamol (ROBAXIN) 500 MG tablet Take 1 tablet (500 mg total) by mouth 2 (two) times daily for 7 days. 07/25/23 08/01/23 Yes Letta Kocher, NP    Family History History reviewed. No pertinent family history.  Social History Social History   Tobacco Use   Smoking status: Every Day   Smokeless tobacco: Never  Substance Use Topics   Alcohol use: Yes   Drug use: No     Allergies   Patient has no known allergies.   Review of Systems Review of Systems  Constitutional:  Negative for chills, fatigue and fever.  Cardiovascular:  Negative for chest pain.  Genitourinary:  Negative for difficulty  urinating, flank pain and hematuria.  Musculoskeletal:  Positive for back pain. Negative for gait problem, joint swelling and neck pain.  Skin:  Negative for color change.  Neurological:  Negative for dizziness, weakness, light-headedness and headaches.     Physical Exam Triage Vital Signs ED Triage Vitals  Encounter Vitals Group     BP 07/25/23 0942 130/86     Systolic BP Percentile --      Diastolic BP Percentile --      Pulse Rate 07/25/23 0942 87     Resp 07/25/23 0942 16     Temp 07/25/23 0942 98.3 F (36.8 C)     Temp Source 07/25/23 0942 Oral     SpO2 07/25/23 0942 94 %     Weight --      Height --      Head Circumference --      Peak Flow --      Pain Score 07/25/23 0944 8     Pain Loc --      Pain Education --      Exclude from Growth Chart --    No data found.  Updated Vital Signs BP 130/86 (BP Location: Left Arm)   Pulse 87   Temp 98.3 F (36.8 C) (Oral)   Resp 16   SpO2 94%   Visual Acuity Right Eye Distance:   Left Eye Distance:   Bilateral Distance:    Right  Eye Near:   Left Eye Near:    Bilateral Near:     Physical Exam Vitals and nursing note reviewed.  Constitutional:      General: She is awake. She is not in acute distress.    Appearance: Normal appearance. She is well-developed and well-groomed. She is not ill-appearing, toxic-appearing or diaphoretic.  Cardiovascular:     Rate and Rhythm: Normal rate.     Heart sounds: Normal heart sounds.  Pulmonary:     Effort: Pulmonary effort is normal. No respiratory distress.     Breath sounds: Normal breath sounds. No wheezing.  Abdominal:     Tenderness: There is no right CVA tenderness or left CVA tenderness.  Musculoskeletal:        General: Tenderness present. No deformity. Normal range of motion.     Cervical back: Normal and normal range of motion.     Thoracic back: Normal.     Lumbar back: Bony tenderness present. No swelling, edema, deformity or signs of trauma. Normal range of  motion.     Right knee: Normal.     Left knee: Normal.     Right lower leg: Normal.     Left lower leg: Normal.  Skin:    General: Skin is warm and dry.  Neurological:     General: No focal deficit present.     Mental Status: She is alert and oriented to person, place, and time.     GCS: GCS eye subscore is 4. GCS verbal subscore is 5. GCS motor subscore is 6.     Sensory: Sensation is intact.     Motor: Motor function is intact.     Coordination: Coordination is intact.     Gait: Gait is intact.  Psychiatric:        Behavior: Behavior is cooperative.      UC Treatments / Results  Labs (all labs ordered are listed, but only abnormal results are displayed) Labs Reviewed - No data to display  EKG   Radiology No results found.  Procedures Procedures (including critical care time)  Medications Ordered in UC Medications  dexamethasone (DECADRON) injection 10 mg (10 mg Intramuscular Given 07/25/23 1011)    Initial Impression / Assessment and Plan / UC Course  I have reviewed the triage vital signs and the nursing notes.  Pertinent labs & imaging results that were available during my care of the patient were reviewed by me and considered in my medical decision making (see chart for details).     Patient presented with back pain that radiates to left flank x 1 week. Patient also endorses some mild intermittent numbness that radiates down left leg. Patient denies known injury.  Patient also endorses chronic left knee pain for fracture few years ago. Patient reports doing ibuprofen with some relief.  Upon assessment she has some lumbar bony tenderness.  Given Decadron injection in clinic. Prescribed muscle relaxer as needed for pain and spasms. Recommended alternating between ibuprofen and Tylenol as needed. Discussed follow-up and return precautions. Final Clinical Impressions(s) / UC Diagnoses   Final diagnoses:  Sciatica of left side  Chronic pain of left knee      Discharge Instructions      You can take Robaxin twice daily as needed for pain and muscle spasms. Otherwise you can alternate between Tylenol and Ibuprofen as needed for pain. You can follow-up with [facility] for chronic knee pain. Return here if symptoms persist.     ED Prescriptions  Medication Sig Dispense Auth. Provider   methocarbamol (ROBAXIN) 500 MG tablet Take 1 tablet (500 mg total) by mouth 2 (two) times daily for 7 days. 14 tablet Wynonia Lawman A, NP      PDMP not reviewed this encounter.   Wynonia Lawman A, NP 07/25/23 1016

## 2023-07-25 NOTE — Discharge Instructions (Addendum)
You can take Robaxin twice daily as needed for pain and muscle spasms. Otherwise you can alternate between Tylenol and Ibuprofen as needed for pain. You can follow-up with [facility] for chronic knee pain. Return here if symptoms persist.

## 2024-02-25 ENCOUNTER — Other Ambulatory Visit: Payer: Self-pay | Admitting: Nurse Practitioner

## 2024-02-25 DIAGNOSIS — Z1231 Encounter for screening mammogram for malignant neoplasm of breast: Secondary | ICD-10-CM

## 2024-03-01 ENCOUNTER — Ambulatory Visit
Admission: RE | Admit: 2024-03-01 | Discharge: 2024-03-01 | Disposition: A | Discharge status: Home / Self Care | Source: Ambulatory Visit | Attending: Nurse Practitioner | Admitting: Nurse Practitioner

## 2024-03-01 DIAGNOSIS — Z1231 Encounter for screening mammogram for malignant neoplasm of breast: Secondary | ICD-10-CM

## 2024-08-13 ENCOUNTER — Other Ambulatory Visit: Payer: Self-pay | Admitting: Nurse Practitioner

## 2024-08-13 DIAGNOSIS — Z1231 Encounter for screening mammogram for malignant neoplasm of breast: Secondary | ICD-10-CM

## 2024-08-16 ENCOUNTER — Other Ambulatory Visit: Payer: Self-pay | Admitting: Nurse Practitioner

## 2024-08-16 DIAGNOSIS — R1900 Intra-abdominal and pelvic swelling, mass and lump, unspecified site: Secondary | ICD-10-CM

## 2024-09-01 ENCOUNTER — Inpatient Hospital Stay
Admission: RE | Admit: 2024-09-01 | Discharge: 2024-09-01 | Disposition: A | Source: Ambulatory Visit | Attending: Nurse Practitioner

## 2024-09-01 DIAGNOSIS — R1900 Intra-abdominal and pelvic swelling, mass and lump, unspecified site: Secondary | ICD-10-CM

## 2024-09-01 MED ORDER — IOPAMIDOL (ISOVUE-300) INJECTION 61%
100.0000 mL | Freq: Once | INTRAVENOUS | Status: AC | PRN
  Administered 2024-09-01: 100 mL via INTRAVENOUS

## 2024-11-03 ENCOUNTER — Ambulatory Visit (HOSPITAL_COMMUNITY): Admission: EM | Admit: 2024-11-03 | Discharge: 2024-11-03 | Disposition: A | Discharge status: Home / Self Care

## 2024-11-03 ENCOUNTER — Encounter (HOSPITAL_COMMUNITY): Payer: Self-pay

## 2024-11-03 DIAGNOSIS — J449 Chronic obstructive pulmonary disease, unspecified: Secondary | ICD-10-CM

## 2024-11-03 DIAGNOSIS — G8929 Other chronic pain: Secondary | ICD-10-CM

## 2024-11-03 DIAGNOSIS — M25511 Pain in right shoulder: Secondary | ICD-10-CM | POA: Diagnosis not present

## 2024-11-03 DIAGNOSIS — R030 Elevated blood-pressure reading, without diagnosis of hypertension: Secondary | ICD-10-CM | POA: Diagnosis not present

## 2024-11-03 HISTORY — DX: Chronic obstructive pulmonary disease, unspecified: J44.9

## 2024-11-03 MED ORDER — BACLOFEN 5 MG PO TABS: 5.0000 mg | ORAL_TABLET | Freq: Two times a day (BID) | ORAL | 0 refills | Status: AC | PRN

## 2024-11-03 NOTE — ED Triage Notes (Signed)
 Patient presenting with right arm pain and reduced range of motion. States it is mainly the shoulder then down into the arm. No known falls or injuries. No history of shoulder pain.   Patient has COPD and has been having SOB flare after walking through the parking lot today.

## 2024-11-03 NOTE — ED Provider Notes (Signed)
 " [facility]    CSN: [id number] Arrival date & time: 11/03/24  1435      History   Chief Complaint Chief Complaint  Patient presents with   Shoulder Pain   Shortness of Breath    HPI Sherry Douglas is a 68 y.o. female.    Right arm/right shoulder pain that started about a month ago Pain has worsened gradually Pain radiates down the right arm Pain started running down the arm about a week ago No paresthesias or rashes No surgeries to the right arm No neck pain, just shoulder pain Shooting pain stays in the middle deltoid and does not go to the elbow or wrist/hand  She had an episode of shortness of breath today while walking in the parking lot Her phelgm has not changed color and is white Chronic cough COPD She still smokes but not as much    Shoulder Pain Shortness of Breath   Past Medical History:  Diagnosis Date   COPD (chronic obstructive pulmonary disease) (HCC)     Patient Active Problem List   Diagnosis Date Noted   LEG CRAMPS 12/06/2010   Alopecia 10/31/2009   ELEVATED BLOOD PRESSURE WITHOUT DIAGNOSIS OF HYPERTENSION 09/04/2009   Dental caries 12/06/2008   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 No pertinent surgical history.  OB History   No obstetric history on file.      Home Medications    Prior to Admission medications  Medication Sig Start Date End Date Taking? Authorizing Provider  albuterol  (VENTOLIN  HFA) 108 (90 Base) MCG/ACT inhaler Inhale 2 puffs into the lungs every 4 (four) hours as needed for shortness of breath. 10/07/24  Yes [provider]    Family History Family History  Problem Relation Age of Onset   BRCA  1/2 Neg Hx    Breast cancer Neg Hx     Social History Social History[1]   Allergies   Patient has no known allergies.   Review of Systems Review of Systems  Respiratory:  Positive for shortness of breath.      Physical Exam Triage Vital Signs ED Triage Vitals  Encounter Vitals Group     BP 11/03/24 1601 (!) 162/107     Girls Systolic BP Percentile --      Girls Diastolic BP Percentile --      Boys Systolic BP Percentile --      Boys Diastolic BP Percentile --      Pulse Rate 11/03/24 1601 85     Resp 11/03/24 1601 18     Temp 11/03/24 1601 98.3 F (36.8 C)     Temp Source 11/03/24 1601 Oral     SpO2 11/03/24 1601 95 %     Weight --      Height 11/03/24 1601 5' 2 (1.575 m)     Head Circumference --      Peak Flow --      Pain Score 11/03/24 1600 10     Pain Loc --      Pain Education --      Exclude from Growth Chart --    No data found.  Updated Vital Signs BP (!) 162/107 (BP Location: Left Arm)   Pulse 85   Temp  98.3 F (36.8 C) (Oral)   Resp 18   Ht 5' 2 (1.575 m)   SpO2 95%   BMI 18.23 kg/m   Visual Acuity Right Eye Distance:   Left Eye Distance:   Bilateral Distance:    Right Eye Near:   Left Eye Near:    Bilateral Near:     Physical Exam   UC Treatments / Results  Labs (all labs ordered are listed, but only abnormal results are displayed) Labs Reviewed - No data to display  EKG   Radiology No results found.  Procedures Procedures (including critical care time)  Medications Ordered in UC Medications - No data to display  Initial Impression / Assessment and Plan / UC Course  I have reviewed the triage vital signs and the nursing notes.  Pertinent labs & imaging results that were available during my care of the patient were reviewed by me and considered in my medical decision making (see chart for details).     *** Final Clinical Impressions(s) / UC Diagnoses   Final diagnoses:  None   Discharge Instructions    None    ED Prescriptions   None    PDMP not reviewed this encounter.      [1] Social History Tobacco Use   Smoking status: Every Day    Types: Cigarettes   Smokeless tobacco: Never  Vaping Use   Vaping status: Never Used  Substance Use Topics   Alcohol use: Yes   Drug use: No  "

## 2024-11-03 NOTE — Discharge Instructions (Addendum)
 Right shoulder pain likely due to muscle spasm. Take tylenol 650mg  every 6 hours as needed for pain. Use baclofen  muscle relaxer at bedtime as needed (or every 12 hours as needed) for muscle spasm. Baclofen  muscle relaxer will make you sleepy so mostly take this at bedtime for pain.  Apply heat/ice as needed for pain to the right shoulder.  Use albuterol  inhaler every 4-6 hours as needed for shortness of breath.   Follow-up with PCP.

## 2025-03-02 ENCOUNTER — Ambulatory Visit
